# Patient Record
Sex: Female | Born: 1935 | Race: White | Hispanic: No | Smoking: Never smoker
Health system: Southern US, Community
[De-identification: ages and names within clinical notes are randomized; demographics above are authoritative.]

## PROBLEM LIST (undated history)

## (undated) DIAGNOSIS — N3941 Urge incontinence: Secondary | ICD-10-CM

## (undated) DIAGNOSIS — I10 Essential (primary) hypertension: Secondary | ICD-10-CM

## (undated) DIAGNOSIS — I499 Cardiac arrhythmia, unspecified: Secondary | ICD-10-CM

## (undated) DIAGNOSIS — F419 Anxiety disorder, unspecified: Secondary | ICD-10-CM

## (undated) DIAGNOSIS — M1990 Unspecified osteoarthritis, unspecified site: Secondary | ICD-10-CM

## (undated) DIAGNOSIS — L900 Lichen sclerosus et atrophicus: Secondary | ICD-10-CM

## (undated) DIAGNOSIS — C519 Malignant neoplasm of vulva, unspecified: Secondary | ICD-10-CM

## (undated) HISTORY — PX: HERNIA REPAIR: SHX51

## (undated) HISTORY — DX: Urge incontinence: N39.41

## (undated) HISTORY — DX: Malignant neoplasm of vulva, unspecified: C51.9

## (undated) HISTORY — PX: APPENDECTOMY: SHX54

## (undated) HISTORY — DX: Essential (primary) hypertension: I10

## (undated) HISTORY — DX: Lichen sclerosus et atrophicus: L90.0

---

## 2003-12-15 HISTORY — PX: RADICAL VULVECTOMY: SHX2286

## 2004-08-29 ENCOUNTER — Ambulatory Visit (HOSPITAL_COMMUNITY): Admission: RE | Admit: 2004-08-29 | Discharge: 2004-08-29 | Payer: Self-pay | Admitting: Obstetrics and Gynecology

## 2004-10-07 ENCOUNTER — Ambulatory Visit: Admission: RE | Admit: 2004-10-07 | Discharge: 2004-10-07 | Payer: Self-pay | Admitting: Gynecology

## 2004-10-21 ENCOUNTER — Ambulatory Visit (HOSPITAL_COMMUNITY): Admission: RE | Admit: 2004-10-21 | Discharge: 2004-10-21 | Payer: Self-pay | Admitting: Gynecology

## 2004-11-11 ENCOUNTER — Ambulatory Visit: Admission: RE | Admit: 2004-11-11 | Discharge: 2004-11-11 | Payer: Self-pay | Admitting: Gynecology

## 2004-12-02 ENCOUNTER — Ambulatory Visit: Admission: RE | Admit: 2004-12-02 | Discharge: 2004-12-02 | Payer: Self-pay | Admitting: Gynecology

## 2007-03-15 HISTORY — PX: VAGINAL HYSTERECTOMY: SUR661

## 2009-04-10 HISTORY — PX: RADICAL VULVECTOMY: SHX2286

## 2014-03-07 ENCOUNTER — Telehealth: Payer: Self-pay | Admitting: *Deleted

## 2014-03-07 NOTE — Telephone Encounter (Signed)
Called Kendra Ballard at [facility] clinic in [place] regarding pt referral. Pt will be seen by Dr. Judeth Porch on April 10 at [name] pt to arrive at 0915. Markus Daft confirmed understanding, will give pt information

## 2014-03-07 NOTE — Telephone Encounter (Signed)
Error

## 2014-03-23 ENCOUNTER — Encounter: Payer: Self-pay | Admitting: Gynecology

## 2014-03-23 ENCOUNTER — Ambulatory Visit: Payer: Medicare Other | Attending: Gynecology | Admitting: Gynecology

## 2014-03-23 VITALS — BP 149/62 | HR 80 | Temp 98.1°F | Resp 16 | Ht 60.2 in | Wt 117.2 lb

## 2014-03-23 DIAGNOSIS — I10 Essential (primary) hypertension: Secondary | ICD-10-CM | POA: Insufficient documentation

## 2014-03-23 DIAGNOSIS — C519 Malignant neoplasm of vulva, unspecified: Secondary | ICD-10-CM

## 2014-03-23 DIAGNOSIS — Z880 Allergy status to penicillin: Secondary | ICD-10-CM | POA: Insufficient documentation

## 2014-03-23 DIAGNOSIS — Z79899 Other long term (current) drug therapy: Secondary | ICD-10-CM | POA: Insufficient documentation

## 2014-03-23 DIAGNOSIS — L940 Localized scleroderma [morphea]: Secondary | ICD-10-CM | POA: Insufficient documentation

## 2014-03-23 NOTE — Patient Instructions (Signed)
Plan for surgery, vulvectomy, with Dr. Fermin Schwab on April 21 at [place] will spend the night one night after surgery.               Preparing for your Surgery  Pre-operative Testing -You will receive a phone call from presurgical testing at [facility] to arrange for a pre-operative testing appointment before your surgery.  This appointment normally occurs one to two weeks before your scheduled surgery.   -Bring your insurance card, copy of an advanced directive if applicable, medication list  -At that visit, you will be asked to sign a consent for a possible blood transfusion in case a transfusion becomes necessary during surgery.  The need for a blood transfusion is rare but having consent is a necessary part of your care.     Day Before Surgery at [place] will be advised to have nothing to eat or drink after midnight the evening before.    Your role in recovery Your role is to become active as soon as directed by your doctor, while still giving yourself time to heal.  Rest when you feel tired. You will be asked to do the following in order to speed your recovery:  - Cough and breathe deeply. This helps toclear and expand your lungs and can prevent pneumonia. You may be given a spirometer to practice deep breathing. A staff member will show you how to use the spirometer. - Do mild physical activity. Walking or moving your legs help your circulation and body functions return to normal. A staff member will help you when you try to walk and will provide you with simple exercises. Do not try to get up or walk alone the first time. - Actively manage your pain. Managing your pain lets you move in comfort. We will ask you to rate your pain on a scale of zero to 10. It is your responsibility to tell your doctor or nurse where and how much you hurt so your pain can be treated.  Special Considerations -If you are diabetic, you may be placed on insulin after surgery to  have closer control over your blood sugars to promote healing and recovery.  This does not mean that you will be discharged on insulin.  If applicable, your oral antidiabetics will be resumed when you are tolerating a solid diet.  -Your final pathology results from surgery should be available by the Friday after surgery and the results will be relayed to you when available.

## 2014-03-23 NOTE — Progress Notes (Addendum)
Consult Note: Gyn-Onc   Kendra Ballard 80 y.o. female  Chief Complaint  Patient presents with  . Recurrent Vulvar cancer    New Consult    Assessment : Recurrent carcinoma the vulva  Plan: Modified left posterior radical vulvectomy on 04/03/2014. The extent of the surgery was discussed and was emphasized that this lesion is very near the anus and therefore wound infection poor healing are high probability.  HPI:  80 year old white female seen in consultation at the request of Dr.Hjerpe regarding management of a newly diagnosed recurrent vulvar carcinoma.  Patient's history dates back to 2005 when she had a stage II carcinoma of the left vulva. At that time she underwent a left modified radical vulvectomy with rhomboid flap reconstruction and left inguinal lymphadenectomy. Margins and lymph nodes were negative.  Patient had a recurrence on the right vulva in 2010 and underwent a modified right radical vulvectomy and right inguinal lymphadenectomy at [facility]. Once again margins and lymph nodes were negative.  Patient gives a history of approximate 6 months of a painful lesion on the left vulva.  Review of Systems:10 point review of systems is negative except as noted in interval history.   Vitals: Blood pressure 149/62, pulse 80, temperature 98.1 F (36.7 C), temperature source Oral, resp. rate 16, height 5' 0.2" (1.529 m), weight 117 lb 3.2 oz (53.162 kg).  Physical Exam: General : The patient is a healthy woman in no acute distress.  HEENT: normocephalic, extraoccular movements normal; neck is supple without thyromegally  Lynphnodes: Supraclavicular and inguinal nodes not enlarged  Abdomen: Soft, non-tender, no ascites, no organomegally, no masses, no hernias  Pelvic:  EGBUS: Normal female . There is a 4 cm lesion on the left posterior vulva adjacent to the anus. Vagina: Normal, no lesions , atrophic Urethra and Bladder: Normal, non-tender  Cervix: Surgically  absent  Uterus: Surgically absent  Bi-manual examination: Non-tender; no adenxal masses or nodularity  Rectal: normal sphincter tone, no masses, no blood  Lower extremities: No edema or varicosities. Normal range of motion      Allergies  Allergen Reactions  . Penicillins Rash    Past Medical History  Diagnosis Date  . Recurrent squamous cell carcinoma of vulva   . Lichen sclerosus   . Hypertension   . Urge incontinence     Past Surgical History  Procedure Laterality Date  . Radical vulvectomy  2005    left modified radical vulvectomy, L inguinal lymphadenectomy, rhomboid flap reconstruction by Dr. Fermin Schwab in [place]  . Hernia repair    . Appendectomy    . Vaginal hysterectomy  03/2007    without LSO  . Radical vulvectomy  04/10/2009    at [facility] for recurrent SCC of the vulva, bilateral inguinal node dissection    Current Outpatient Prescriptions  Medication Sig Dispense Refill  . clonazePAM (KLONOPIN) 0.5 MG tablet       . hydrochlorothiazide (HYDRODIURIL) 25 MG tablet Take 25 mg by mouth daily.       No current facility-administered medications for this visit.    History   Social History  . Marital Status: Married    Spouse Name: N/A    Number of Children: N/A  . Years of Education: N/A   Occupational History  . Not on file.   Social History Main Topics  . Smoking status: Never Smoker   . Smokeless tobacco: Not on file  . Alcohol Use: No  . Drug Use: No  . Sexual Activity:  Not on file   Other Topics Concern  . Not on file   Social History Narrative  . No narrative on file    Family History  Problem Relation Age of Onset  . Hypertension Sister       Dorothyann Gibbs, MD 03/23/2014, 10:39 AM

## 2014-03-26 NOTE — Progress Notes (Signed)
Surgery on 4/21//15.  Need orders in EPIC.  Thank You.  

## 2014-03-27 ENCOUNTER — Encounter (HOSPITAL_COMMUNITY): Payer: Self-pay | Admitting: Pharmacy Technician

## 2014-03-30 ENCOUNTER — Telehealth: Payer: Self-pay | Admitting: *Deleted

## 2014-03-30 ENCOUNTER — Encounter (HOSPITAL_COMMUNITY)
Admission: RE | Admit: 2014-03-30 | Discharge: 2014-03-30 | Disposition: A | Discharge status: Home / Self Care | Payer: Medicare Other | Source: Ambulatory Visit | Attending: Gynecology | Admitting: Gynecology

## 2014-03-30 ENCOUNTER — Encounter (HOSPITAL_COMMUNITY): Payer: Self-pay

## 2014-03-30 ENCOUNTER — Ambulatory Visit (HOSPITAL_COMMUNITY)
Admission: RE | Admit: 2014-03-30 | Discharge: 2014-03-30 | Disposition: A | Discharge status: Home / Self Care | Payer: Medicare Other | Source: Ambulatory Visit | Attending: Gynecology | Admitting: Gynecology

## 2014-03-30 DIAGNOSIS — R9389 Abnormal findings on diagnostic imaging of other specified body structures: Secondary | ICD-10-CM

## 2014-03-30 DIAGNOSIS — Z01810 Encounter for preprocedural cardiovascular examination: Secondary | ICD-10-CM | POA: Insufficient documentation

## 2014-03-30 DIAGNOSIS — Z01818 Encounter for other preprocedural examination: Secondary | ICD-10-CM | POA: Insufficient documentation

## 2014-03-30 DIAGNOSIS — Z01812 Encounter for preprocedural laboratory examination: Secondary | ICD-10-CM | POA: Insufficient documentation

## 2014-03-30 DIAGNOSIS — R9431 Abnormal electrocardiogram [ECG] [EKG]: Secondary | ICD-10-CM | POA: Insufficient documentation

## 2014-03-30 HISTORY — DX: Unspecified osteoarthritis, unspecified site: M19.90

## 2014-03-30 HISTORY — DX: Anxiety disorder, unspecified: F41.9

## 2014-03-30 LAB — COMPREHENSIVE METABOLIC PANEL
ALT: 12 U/L (ref 0–35)
AST: 22 U/L (ref 0–37)
Albumin: 3.9 g/dL (ref 3.5–5.2)
Alkaline Phosphatase: 56 U/L (ref 39–117)
BUN: 22 mg/dL (ref 6–23)
CO2: 27 mEq/L (ref 19–32)
Calcium: 10.1 mg/dL (ref 8.4–10.5)
Chloride: 100 mEq/L (ref 96–112)
Creatinine, Ser: 1.08 mg/dL (ref 0.50–1.10)
GFR calc Af Amer: 52 mL/min — ABNORMAL LOW (ref >90)
GFR calc non Af Amer: 44 mL/min — ABNORMAL LOW (ref >90)
Glucose, Bld: 94 mg/dL (ref 70–99)
Potassium: 4.2 mEq/L (ref 3.7–5.3)
Sodium: 139 mEq/L (ref 137–147)
Total Bilirubin: 0.9 mg/dL (ref 0.3–1.2)
Total Protein: 7.4 g/dL (ref 6.0–8.3)

## 2014-03-30 LAB — CBC WITH DIFFERENTIAL/PLATELET
Basophils Absolute: 0.1 10*3/uL (ref 0.0–0.1)
Basophils Relative: 1 % (ref 0–1)
Eosinophils Absolute: 0.1 10*3/uL (ref 0.0–0.7)
Eosinophils Relative: 1 % (ref 0–5)
HCT: 39.9 % (ref 36.0–46.0)
Hemoglobin: 13.6 g/dL (ref 12.0–15.0)
Lymphocytes Relative: 33 % (ref 12–46)
Lymphs Abs: 2.5 10*3/uL (ref 0.7–4.0)
MCH: 31.1 pg (ref 26.0–34.0)
MCHC: 34.1 g/dL (ref 30.0–36.0)
MCV: 91.1 fL (ref 78.0–100.0)
Monocytes Absolute: 0.5 10*3/uL (ref 0.1–1.0)
Monocytes Relative: 7 % (ref 3–12)
Neutro Abs: 4.5 10*3/uL (ref 1.7–7.7)
Neutrophils Relative %: 59 % (ref 43–77)
Platelets: 187 10*3/uL (ref 150–400)
RBC: 4.38 MIL/uL (ref 3.87–5.11)
RDW: 12.9 % (ref 11.5–15.5)
WBC: 7.7 10*3/uL (ref 4.0–10.5)

## 2014-03-30 LAB — ABO/RH: ABO/RH(D): A POS

## 2014-03-30 NOTE — Pre-Procedure Instructions (Signed)
PREOP EKG AND CXR WERE DONE AT [place].  CXR REPORT ABNORMAL - CT CHEST SUGGESTED - SPOKE WITH MELISSA AT DR. CLARKE-PEARSON'S OFFICE - [place] - DR. CLARKE-PEARSON PLANS TO DO FOLLOW UP CT CHEST SEVERAL WEEKS AFTER PT'S SURGERY.

## 2014-03-30 NOTE — Telephone Encounter (Signed)
Called Gaspar Skeeters [phone], (family member) regarding chest xray, pt will have CT scan to follow up on results after surgery. CT appt scheduled for Friday 5/8 at 3pm.LMOVM as Gaspar Skeeters was unavailable requested call back to confirm message received.

## 2014-03-30 NOTE — Patient Instructions (Addendum)
   YOUR SURGERY IS SCHEDULED AT [facility]  ON:  Tuesday  4/21  REPORT TO  SHORT STAY CENTER AT:  5:30 AM      PHONE # FOR SHORT STAY IS [phone]  DO NOT EAT OR DRINK ANYTHING AFTER MIDNIGHT THE NIGHT BEFORE YOUR SURGERY.  YOU MAY BRUSH YOUR TEETH, RINSE OUT YOUR MOUTH--BUT NO WATER, NO FOOD, NO CHEWING GUM, NO MINTS, NO CANDIES, NO CHEWING TOBACCO.  PLEASE TAKE THE FOLLOWING MEDICATIONS THE AM OF YOUR SURGERY WITH A FEW SIPS OF WATER: CLONAZEPAM   DO NOT BRING VALUABLES, MONEY, CREDIT CARDS.  DO NOT WEAR JEWELRY, MAKE-UP, NAIL POLISH AND NO METAL PINS OR CLIPS IN YOUR HAIR. CONTACT LENS, DENTURES / PARTIALS, GLASSES SHOULD NOT BE WORN TO SURGERY AND IN MOST CASES-HEARING AIDS WILL NEED TO BE REMOVED.  BRING YOUR GLASSES CASE, ANY EQUIPMENT NEEDED FOR YOUR CONTACT LENS. FOR PATIENTS ADMITTED TO THE HOSPITAL--CHECK OUT TIME THE DAY OF DISCHARGE IS 11:00 AM.  ALL INPATIENT ROOMS ARE PRIVATE - WITH BATHROOM, TELEPHONE, TELEVISION AND WIFI INTERNET.                                                    PLEASE READ OVER ANY  FACT SHEETS THAT YOU WERE GIVEN:  BLOOD TRANSFUSION INFORMATION, INCENTIVE SPIROMETER INFORMATION. AFTER YOUR SURGERY - REMEMBER TO DEEP BREATHE, COUGH  -TO HELP PREVENT LUNG COMPLICATIONS, TURN FREQUENTLY WHILE LYING IN BED, MOVE YOUR LEGS UP AND DOWN- TO HELP PREVENT BLOOD CLOTS.  FAILURE TO FOLLOW THESE INSTRUCTIONS MAY RESULT IN THE CANCELLATION OF YOUR SURGERY. PLEASE BE AWARE THAT YOU MAY NEED ADDITIONAL BLOOD DRAWN DAY OF YOUR SURGERY  PATIENT SIGNATURE_________________________________

## 2014-04-02 ENCOUNTER — Telehealth: Payer: Self-pay | Admitting: *Deleted

## 2014-04-02 NOTE — Telephone Encounter (Signed)
Called [place], unable to reach,left msg with reminder for NPO after midnight.

## 2014-04-03 ENCOUNTER — Encounter (HOSPITAL_COMMUNITY)
Admission: RE | Disposition: A | Discharge status: Home / Self Care | Payer: Self-pay | Source: Ambulatory Visit | Attending: Gynecology

## 2014-04-03 ENCOUNTER — Inpatient Hospital Stay (HOSPITAL_COMMUNITY)
Admission: RE | Admit: 2014-04-03 | Discharge: 2014-04-05 | DRG: 746 | Disposition: A | Discharge status: Home / Self Care | Payer: Medicare Other | Source: Ambulatory Visit | Attending: Obstetrics & Gynecology | Admitting: Obstetrics & Gynecology

## 2014-04-03 ENCOUNTER — Inpatient Hospital Stay (HOSPITAL_COMMUNITY): Payer: Medicare Other | Admitting: Registered Nurse

## 2014-04-03 ENCOUNTER — Encounter (HOSPITAL_COMMUNITY): Payer: Self-pay | Admitting: *Deleted

## 2014-04-03 ENCOUNTER — Encounter (HOSPITAL_COMMUNITY): Payer: Medicare Other | Admitting: Registered Nurse

## 2014-04-03 ENCOUNTER — Other Ambulatory Visit: Payer: Self-pay

## 2014-04-03 DIAGNOSIS — F411 Generalized anxiety disorder: Secondary | ICD-10-CM | POA: Diagnosis present

## 2014-04-03 DIAGNOSIS — I359 Nonrheumatic aortic valve disorder, unspecified: Secondary | ICD-10-CM

## 2014-04-03 DIAGNOSIS — I4891 Unspecified atrial fibrillation: Secondary | ICD-10-CM | POA: Clinically undetermined

## 2014-04-03 DIAGNOSIS — C519 Malignant neoplasm of vulva, unspecified: Secondary | ICD-10-CM

## 2014-04-03 DIAGNOSIS — R55 Syncope and collapse: Secondary | ICD-10-CM | POA: Diagnosis present

## 2014-04-03 DIAGNOSIS — L940 Localized scleroderma [morphea]: Secondary | ICD-10-CM | POA: Diagnosis present

## 2014-04-03 DIAGNOSIS — I519 Heart disease, unspecified: Secondary | ICD-10-CM | POA: Diagnosis not present

## 2014-04-03 DIAGNOSIS — R32 Unspecified urinary incontinence: Secondary | ICD-10-CM | POA: Diagnosis not present

## 2014-04-03 DIAGNOSIS — I10 Essential (primary) hypertension: Secondary | ICD-10-CM | POA: Diagnosis present

## 2014-04-03 DIAGNOSIS — Y838 Other surgical procedures as the cause of abnormal reaction of the patient, or of later complication, without mention of misadventure at the time of the procedure: Secondary | ICD-10-CM | POA: Diagnosis not present

## 2014-04-03 DIAGNOSIS — Z880 Allergy status to penicillin: Secondary | ICD-10-CM

## 2014-04-03 DIAGNOSIS — R1900 Intra-abdominal and pelvic swelling, mass and lump, unspecified site: Secondary | ICD-10-CM | POA: Diagnosis present

## 2014-04-03 DIAGNOSIS — Z8249 Family history of ischemic heart disease and other diseases of the circulatory system: Secondary | ICD-10-CM

## 2014-04-03 HISTORY — PX: VULVECTOMY: SHX1086

## 2014-04-03 LAB — TYPE AND SCREEN
ABO/RH(D): A POS
Antibody Screen: NEGATIVE

## 2014-04-03 LAB — GLUCOSE, CAPILLARY: Glucose-Capillary: 148 mg/dL — ABNORMAL HIGH (ref 70–99)

## 2014-04-03 SURGERY — VULVECTOMY
Anesthesia: General | Laterality: Left

## 2014-04-03 MED ORDER — ONDANSETRON HCL 4 MG/2ML IJ SOLN
INTRAMUSCULAR | Status: AC
  Filled 2014-04-03: qty 2

## 2014-04-03 MED ORDER — CLONAZEPAM 0.5 MG PO TABS
0.5000 mg | ORAL_TABLET | Freq: Two times a day (BID) | ORAL | Status: DC
  Administered 2014-04-03 – 2014-04-05 (×5): 0.5 mg via ORAL
  Filled 2014-04-03 (×5): qty 1

## 2014-04-03 MED ORDER — ONDANSETRON HCL 4 MG/2ML IJ SOLN
INTRAMUSCULAR | Status: DC | PRN
  Administered 2014-04-03: 4 mg via INTRAVENOUS

## 2014-04-03 MED ORDER — FENTANYL CITRATE 0.05 MG/ML IJ SOLN
INTRAMUSCULAR | Status: AC
  Filled 2014-04-03: qty 5

## 2014-04-03 MED ORDER — ONDANSETRON HCL 4 MG/2ML IJ SOLN: 4.0000 mg | Freq: Four times a day (QID) | INTRAMUSCULAR | Status: DC | PRN

## 2014-04-03 MED ORDER — FENTANYL CITRATE 0.05 MG/ML IJ SOLN
INTRAMUSCULAR | Status: DC | PRN
  Administered 2014-04-03: 25 ug via INTRAVENOUS
  Administered 2014-04-03: 50 ug via INTRAVENOUS

## 2014-04-03 MED ORDER — METOPROLOL TARTRATE 1 MG/ML IV SOLN
INTRAVENOUS | Status: AC
  Filled 2014-04-03: qty 5

## 2014-04-03 MED ORDER — EPHEDRINE SULFATE 50 MG/ML IJ SOLN
INTRAMUSCULAR | Status: AC
  Filled 2014-04-03: qty 1

## 2014-04-03 MED ORDER — METOPROLOL TARTRATE 1 MG/ML IV SOLN
5.0000 mg | Freq: Once | INTRAVENOUS | Status: AC
  Administered 2014-04-03: 5 mg via INTRAVENOUS

## 2014-04-03 MED ORDER — DILTIAZEM LOAD VIA INFUSION
10.0000 mg | Freq: Once | INTRAVENOUS | Status: DC
  Filled 2014-04-03: qty 10

## 2014-04-03 MED ORDER — KCL IN DEXTROSE-NACL 20-5-0.45 MEQ/L-%-% IV SOLN
INTRAVENOUS | Status: DC
  Administered 2014-04-03 – 2014-04-04 (×3): via INTRAVENOUS
  Filled 2014-04-03 (×4): qty 1000

## 2014-04-03 MED ORDER — SODIUM CHLORIDE 0.9 % IJ SOLN
INTRAMUSCULAR | Status: AC
  Filled 2014-04-03: qty 10

## 2014-04-03 MED ORDER — HYDROCHLOROTHIAZIDE 25 MG PO TABS
25.0000 mg | ORAL_TABLET | Freq: Every day | ORAL | Status: DC
  Filled 2014-04-03: qty 1

## 2014-04-03 MED ORDER — KCL IN DEXTROSE-NACL 20-5-0.45 MEQ/L-%-% IV SOLN
INTRAVENOUS | Status: AC
  Filled 2014-04-03: qty 1000

## 2014-04-03 MED ORDER — SODIUM CHLORIDE 0.9 % IR SOLN
Status: DC | PRN
  Administered 2014-04-03: 1000 mL

## 2014-04-03 MED ORDER — DILTIAZEM HCL 100 MG IV SOLR
5.0000 mg/h | INTRAVENOUS | Status: DC
  Administered 2014-04-03: 10 mg/h via INTRAVENOUS
  Administered 2014-04-04: 5 mg/h via INTRAVENOUS
  Filled 2014-04-03 (×2): qty 100

## 2014-04-03 MED ORDER — PROMETHAZINE HCL 25 MG/ML IJ SOLN: 6.2500 mg | INTRAMUSCULAR | Status: DC | PRN

## 2014-04-03 MED ORDER — KETOROLAC TROMETHAMINE 15 MG/ML IJ SOLN
15.0000 mg | Freq: Four times a day (QID) | INTRAMUSCULAR | Status: DC | PRN
  Filled 2014-04-03: qty 1

## 2014-04-03 MED ORDER — EPHEDRINE SULFATE 50 MG/ML IJ SOLN
INTRAMUSCULAR | Status: DC | PRN
  Administered 2014-04-03: 5 mg via INTRAVENOUS

## 2014-04-03 MED ORDER — LIDOCAINE HCL (CARDIAC) 20 MG/ML IV SOLN
INTRAVENOUS | Status: DC | PRN
  Administered 2014-04-03: 80 mg via INTRAVENOUS

## 2014-04-03 MED ORDER — FENTANYL CITRATE 0.05 MG/ML IJ SOLN: 25.0000 ug | INTRAMUSCULAR | Status: DC | PRN

## 2014-04-03 MED ORDER — OXYCODONE-ACETAMINOPHEN 5-325 MG PO TABS
1.0000 | ORAL_TABLET | ORAL | Status: DC | PRN
  Administered 2014-04-04 – 2014-04-05 (×2): 1 via ORAL
  Filled 2014-04-03 (×2): qty 1

## 2014-04-03 MED ORDER — LACTATED RINGERS IV SOLN
INTRAVENOUS | Status: DC | PRN
  Administered 2014-04-03: 07:00:00 via INTRAVENOUS

## 2014-04-03 MED ORDER — MEPERIDINE HCL 50 MG/ML IJ SOLN: 6.2500 mg | INTRAMUSCULAR | Status: DC | PRN

## 2014-04-03 MED ORDER — LIDOCAINE HCL (CARDIAC) 20 MG/ML IV SOLN
INTRAVENOUS | Status: AC
  Filled 2014-04-03: qty 5

## 2014-04-03 MED ORDER — PROPOFOL 10 MG/ML IV BOLUS
INTRAVENOUS | Status: DC | PRN
Start: 2014-04-03 — End: 2014-04-03
  Administered 2014-04-03: 90 mg via INTRAVENOUS

## 2014-04-03 MED ORDER — PROPOFOL 10 MG/ML IV BOLUS
INTRAVENOUS | Status: AC
  Filled 2014-04-03: qty 20

## 2014-04-03 MED ORDER — ONDANSETRON HCL 4 MG PO TABS: 4.0000 mg | ORAL_TABLET | Freq: Four times a day (QID) | ORAL | Status: DC | PRN

## 2014-04-03 SURGICAL SUPPLY — 21 items
BLADE SURG 15 STRL LF DISP TIS (BLADE) ×2 IMPLANT
BLADE SURG 15 STRL SS (BLADE) ×6
CANISTER SUCTION 2500CC (MISCELLANEOUS) ×1 IMPLANT
COVER MAYO STAND STRL (DRAPES) ×4 IMPLANT
DRSG PAD ABDOMINAL 8X10 ST (GAUZE/BANDAGES/DRESSINGS) ×2 IMPLANT
GLOVE BIO SURGEON STRL SZ7.5 (GLOVE) ×6 IMPLANT
GOWN STRL REUS W/ TWL LRG LVL3 (GOWN DISPOSABLE) IMPLANT
GOWN STRL REUS W/TWL LRG LVL3 (GOWN DISPOSABLE) ×8 IMPLANT
MARKER SKIN DUAL TIP RULER LAB (MISCELLANEOUS) ×3 IMPLANT
NS IRRIG 1000ML POUR BTL (IV SOLUTION) ×1 IMPLANT
PACK ICE MAXI GEL EZY WRAP (MISCELLANEOUS) ×3 IMPLANT
PACK MINOR VAGINAL W LONG (CUSTOM PROCEDURE TRAY) ×3 IMPLANT
SUT VIC AB 0 CT1 27 (SUTURE) ×3
SUT VIC AB 0 CT1 27XBRD ANTBC (SUTURE) IMPLANT
SUT VIC AB 2-0 CT2 27 (SUTURE) ×2 IMPLANT
SUT VIC AB 3-0 SH 27 (SUTURE)
SUT VIC AB 3-0 SH 27X BRD (SUTURE) ×4 IMPLANT
TOWEL OR 17X26 10 PK STRL BLUE (TOWEL DISPOSABLE) ×2 IMPLANT
TOWEL OR NON WOVEN STRL DISP B (DISPOSABLE) ×2 IMPLANT
TRAY FOLEY CATH 14FRSI W/METER (CATHETERS) ×2 IMPLANT
WATER STERILE IRR 1500ML POUR (IV SOLUTION) ×5 IMPLANT

## 2014-04-03 NOTE — Progress Notes (Signed)
Pt arrived via wheelchair brought in by her niece Gaspar Skeeters.  Pt states she fell this morning in the shower.  She states she did not hit her head but was unable to get up.  Her niece Gaspar Skeeters arrived and helped her up out of shower.  Pt is noted to have severe bruising on her upper back and shoulders, both elbows are bruised with a small skin tear on left elbow. Her upper buttocks and sacral area are also noted to be bruised.  Pt states she feels weak but is not hurting.

## 2014-04-03 NOTE — Interval H&P Note (Signed)
History and Physical Interval Note:  04/03/2014 7:00 AM  Kendra Ballard  has presented today for surgery, with the diagnosis of vulvar cancer  The various methods of treatment have been discussed with the patient and family. After consideration of risks, benefits and other options for treatment, the patient has consented to  Procedure(s): MODIFIED LEFT POSTERIOR RADICAL VULVECTOMY (Left) as a surgical intervention .  The patient's history has been reviewed, patient examined, she has brusing over her shoulder and elbows from a fall in the bathtub this morning.  She did not hit her head or lose conciousness.  She has been evaluated by anesthesia and we agree that she is stable for surgery.  I have reviewed the patient's chart and labs.  Questions were answered to the patient's satisfaction.     [place]

## 2014-04-03 NOTE — H&P (View-Only) (Signed)
Consult Note: Gyn-Onc   Kendra Ballard 80 y.o. female  Chief Complaint  Patient presents with  . Recurrent Vulvar cancer    New Consult    Assessment : Recurrent carcinoma the vulva  Plan: Modified left posterior radical vulvectomy on 04/03/2014. The extent of the surgery was discussed and was emphasized that this lesion is very near the anus and therefore wound infection poor healing are high probability.  HPI:  80 year old white female seen in consultation at the request of Dr.Hjerpe regarding management of a newly diagnosed recurrent vulvar carcinoma.  Patient's history dates back to 2005 when she had a stage II carcinoma of the left vulva. At that time she underwent a left modified radical vulvectomy with rhomboid flap reconstruction and left inguinal lymphadenectomy. Margins and lymph nodes were negative.  Patient had a recurrence on the right vulva in 2010 and underwent a modified right radical vulvectomy and right inguinal lymphadenectomy at [facility]. Once again margins and lymph nodes were negative.  Patient gives a history of approximate 6 months of a painful lesion on the left vulva.  Review of Systems:10 point review of systems is negative except as noted in interval history.   Vitals: Blood pressure 149/62, pulse 80, temperature 98.1 F (36.7 C), temperature source Oral, resp. rate 16, height 5' 0.2" (1.529 m), weight 117 lb 3.2 oz (53.162 kg).  Physical Exam: General : The patient is a healthy woman in no acute distress.  HEENT: normocephalic, extraoccular movements normal; neck is supple without thyromegally  Lynphnodes: Supraclavicular and inguinal nodes not enlarged  Abdomen: Soft, non-tender, no ascites, no organomegally, no masses, no hernias  Pelvic:  EGBUS: Normal female . There is a 4 cm lesion on the left posterior vulva adjacent to the anus. Vagina: Normal, no lesions , atrophic Urethra and Bladder: Normal, non-tender  Cervix: Surgically  absent  Uterus: Surgically absent  Bi-manual examination: Non-tender; no adenxal masses or nodularity  Rectal: normal sphincter tone, no masses, no blood  Lower extremities: No edema or varicosities. Normal range of motion      Allergies  Allergen Reactions  . Penicillins Rash    Past Medical History  Diagnosis Date  . Recurrent squamous cell carcinoma of vulva   . Lichen sclerosus   . Hypertension   . Urge incontinence     Past Surgical History  Procedure Laterality Date  . Radical vulvectomy  2005    left modified radical vulvectomy, L inguinal lymphadenectomy, rhomboid flap reconstruction by Dr. Fermin Schwab in [place]  . Hernia repair    . Appendectomy    . Vaginal hysterectomy  03/2007    without LSO  . Radical vulvectomy  04/10/2009    at [facility] for recurrent SCC of the vulva, bilateral inguinal node dissection    Current Outpatient Prescriptions  Medication Sig Dispense Refill  . clonazePAM (KLONOPIN) 0.5 MG tablet       . hydrochlorothiazide (HYDRODIURIL) 25 MG tablet Take 25 mg by mouth daily.       No current facility-administered medications for this visit.    History   Social History  . Marital Status: Married    Spouse Name: N/A    Number of Children: N/A  . Years of Education: N/A   Occupational History  . Not on file.   Social History Main Topics  . Smoking status: Never Smoker   . Smokeless tobacco: Not on file  . Alcohol Use: No  . Drug Use: No  . Sexual Activity:  Not on file   Other Topics Concern  . Not on file   Social History Narrative  . No narrative on file    Family History  Problem Relation Age of Onset  . Hypertension Sister       Kendra Gibbs, MD 03/23/2014, 10:39 AM

## 2014-04-03 NOTE — Anesthesia Preprocedure Evaluation (Addendum)
Anesthesia Evaluation  Patient identified by MRN, date of birth, ID band Patient awake    Reviewed: Allergy & Precautions, H&P , NPO status , Patient's Chart, lab work & pertinent test results  Airway Mallampati: II TM Distance: >3 FB Neck ROM: Full    Dental no notable dental hx. (+) Partial Upper, Partial Lower   Pulmonary neg pulmonary ROS,  breath sounds clear to auscultation  Pulmonary exam normal       Cardiovascular hypertension, Pt. on medications negative cardio ROS  Rhythm:Regular Rate:Normal     Neuro/Psych negative neurological ROS  negative psych ROS   GI/Hepatic negative GI ROS, Neg liver ROS,   Endo/Other  negative endocrine ROS  Renal/GU negative Renal ROS  negative genitourinary   Musculoskeletal negative musculoskeletal ROS (+)   Abdominal   Peds negative pediatric ROS (+)  Hematology negative hematology ROS (+)   Anesthesia Other Findings Pt fell in shower this AM. No LOC. Denies neck pain or splinting or CP SOB. Echymosis over both shoulders.  Reproductive/Obstetrics negative OB ROS                        Anesthesia Physical Anesthesia Plan  ASA: II  Anesthesia Plan: General   Post-op Pain Management:    Induction: Intravenous  Airway Management Planned: Oral ETT  Additional Equipment:   Intra-op Plan:   Post-operative Plan: Extubation in OR  Informed Consent: I have reviewed the patients History and Physical, chart, labs and discussed the procedure including the risks, benefits and alternatives for the proposed anesthesia with the patient or authorized representative who has indicated his/her understanding and acceptance.   Dental advisory given  Plan Discussed with: CRNA  Anesthesia Plan Comments: (Fall in shower this AM. OK to proceed)        Anesthesia Quick Evaluation

## 2014-04-03 NOTE — Progress Notes (Signed)
HR accelerated to 120. Dr. Samuella Cota aware. BP 129/82. Denies chest pain, feeling of heart racing or fluttering, shortness of breath. 12 lead EKG done and shown to Dr. Marcell Barlow and Dr. Loletta SpecterDianah Field. They will call cardiology.

## 2014-04-03 NOTE — Progress Notes (Signed)
Notified Dr. Aldean Ast of pt's fall this morning.  Pt did state she feels she laid in the tub for approximately 2hrs before her niece arrived.  Dr. Aldean Ast made aware of this and also of all the noted bruising.

## 2014-04-03 NOTE — Anesthesia Postprocedure Evaluation (Signed)
  Anesthesia Post-op Note  Patient: Kendra Ballard  Procedure(s) Performed: Procedure(s) (LRB): MODIFIED LEFT POSTERIOR RADICAL VULVECTOMY (Left)  Patient Location: PACU  Anesthesia Type: General  Level of Consciousness: awake and alert   Airway and Oxygen Therapy: Patient Spontanous Breathing  Post-op Pain: mild  Post-op Assessment: Post-op Vital signs reviewed, Patient's Cardiovascular Status Stable, Respiratory Function Stable, Patent Airway and No signs of Nausea or vomiting  Last Vitals:  Filed Vitals:   04/03/14 0945  BP: 126/73  Pulse: 113  Temp:   Resp: 21    Post-op Vital Signs: stable   Complications: No apparent anesthesia complications. New onset A-fib in [place]. May explain syncopal episode this AM. Cardiology consult. Stable now

## 2014-04-03 NOTE — Progress Notes (Signed)
Red, bruised areas not on top of shoulders across back, on left hip near greater trochanter area, and on elbows bilaterally. Rt elbow with skin tear covered with Tegaderm.

## 2014-04-03 NOTE — Op Note (Signed)
Kendra Ballard  female MEDICAL RECORD BS:[id number] DATE OF BIRTH: 1923/12/12 PHYSICIAN: Marti Sleigh, M.D  04/03/2014   OPERATIVE REPORT  PREOPERATIVE DIAGNOSIS: Recurrent vulvar carcinoma  POSTOPERATIVE DIAGNOSIS:Same  PROCEDURE: Modified left posterior radical vulvectomy  SURGEON: Marti Sleigh, M.D ASSISTANT: Lahoma Crocker, MD ANESTHESIA: LMA ESTIMATED BLOOD LOSS:Minimal  SURGICAL FINDINGS:Examination under anesthesia revealed a 4 cm ulcerative lesion on the left posterior vulva adjacent to the anus.  There were no palpable inguinal nodes.  PROCEDURE:The patient brought the operating room and after satisfactory attainment of general anesthesia was placed in the modified lithotomy position in [place].  The perineum and vagina were prepped the patient was draped and a Foley catheter was placed.  Surgical timeout was taken.  The vulvar lesion was identified and an elliptical incision was made around the lesion with the apex on the left mid vulva.  The medial incision extended to the anal verge.  The subcutaneous tissue was then undermined and excised using electrocautery.  The specimen was oriented with a suture at 12:00.  Hemostasis achieved with cautery.  The deep subcutaneous layers were closed with interrupted sutures of 0 Vicryl.  Interrupted subcuticular sutures were taken to reapproximate the skin edges using 2-0 Vicryl.  Finally, mattress sutures of 2-0 Vicryl were taken to reapproximate the skin.  The patient was awakened from anesthesia and taken to the recovery room in satisfactory condition.  Sponge, needle, and instrument counts were correct x2.   Marti Sleigh, M.D

## 2014-04-03 NOTE — Progress Notes (Signed)
  Echocardiogram 2D Echocardiogram has been performed.  Kendra Ballard 04/03/2014, 3:19 PM 

## 2014-04-03 NOTE — Progress Notes (Signed)
Tegaderm applied on left elbow to small skin tear.

## 2014-04-03 NOTE — Transfer of Care (Signed)
Immediate Anesthesia Transfer of Care Note  Patient: Kendra Ballard  Procedure(s) Performed: Procedure(s): MODIFIED LEFT POSTERIOR RADICAL VULVECTOMY (Left)  Patient Location: PACU  Anesthesia Type:General  Level of Consciousness: awake, alert , oriented and patient cooperative  Airway & Oxygen Therapy: Patient Spontanous Breathing and Patient connected to face mask oxygen  Post-op Assessment: Report given to PACU RN, Post -op Vital signs reviewed and stable and Patient moving all extremities  Post vital signs: Reviewed and stable  Complications: No apparent anesthesia complications

## 2014-04-03 NOTE — Consult Note (Signed)
CARDIOLOGY CONSULTATION NOTE.  NAME:  Kendra Ballard   MRN: [id number] DOB:  [birth year]   ADMIT DATE: 04/03/2014  Reason for Consult: Post-Operative Afib RVR; Syncope  Requesting Physician: Dr. Merceda Elks  Primary Cardiologist: None  HPI: This is a 80 y.o. female with a past medical history significant for HTN, but no reported cardiac history who presented this AM for surgery - Vulvectomy.  Was otherwise in [place] until this AM, when she passed out in the shower -- does not recall any symptoms prior to passing out, just collapsed -- no rapid / irregular HR, no palpitations, lightheadedness/ wooziness or prior syncope / near-syncope, TIA or Amaurosis [place] Sx.  Did not hit head, has significant arm/shoulder bruising.  Proceeded with operation. Has not had any CP with rest or exertion. No PND, orthopnea or edema.  No melena, hematochezia, or epistaxis.  No hematuria.  Post-op, she went into Afib with RVR - 120s-150s.  In the PACU, she remains asymptomatic with no sensation of rapid or irregular HR, no CP or dyspnea.    PMHx:  CARDIAC HISTORY: No formal studies.  Past Medical History  Diagnosis Date  . Recurrent squamous cell carcinoma of vulva   . Lichen sclerosus   . Hypertension   . Urge incontinence   . Anxiety   . Arthritis     HANDS   Past Surgical History  Procedure Laterality Date  . Radical vulvectomy  2005    left modified radical vulvectomy, L inguinal lymphadenectomy, rhomboid flap reconstruction by Dr. Fermin Schwab in [place]  . Hernia repair    . Appendectomy    . Vaginal hysterectomy  03/2007    without LSO  . Radical vulvectomy  04/10/2009    at [facility] for recurrent SCC of the vulva, bilateral inguinal node dissection    FAMHx: Family History  Problem Relation Age of Onset  . Hypertension Sister     SOCHx:  reports that she has never smoked. She does not have any smokeless tobacco history on file. She reports that she does not drink alcohol or use  illicit drugs.  ALLERGIES: Allergies  Allergen Reactions  . Penicillins Rash   ROS: A comprehensive review of systems was negative except for: vulvar pain & syncopal event as noted above  HOME MEDICATIONS: Prescriptions prior to admission  Medication Sig Dispense Refill  . clonazePAM (KLONOPIN) 0.5 MG tablet Take 0.5 mg by mouth 2 (two) times daily.       . hydrochlorothiazide (HYDRODIURIL) 25 MG tablet Take 25 mg by mouth daily. PT STATES SHE USUALLY ONLY TAKES EVERY OTHER DAY      . aspirin-acetaminophen-caffeine (EXCEDRIN MIGRAINE) 250-250-65 MG per tablet Take 1 tablet by mouth every 6 (six) hours as needed for headache.      . Multiple Vitamin (MULTIVITAMIN WITH MINERALS) TABS tablet Take 1 tablet by mouth daily.        HOSPITAL MEDICATIONS: Scheduled Meds: . dextrose 5 % and 0.45 % NaCl with KCl 20 mEq/L      . diltiazem  10 mg Intravenous Once  . metoprolol    Ordered by Me during consult   Continuous Infusions: . dextrose 5 % and 0.45 % NaCl with KCl 20 mEq/L 50 mL/hr at 04/03/14 1001 Ordered by Me   . diltiazem (CARDIZEM) infusion 5 mg/hr (04/03/14 1132)  during consult   PRN Meds:.fentaNYL, meperidine (DEMEROL) injection, promethazine  VITALS: Blood pressure 95/73, pulse 138, temperature 98.3 F (36.8 C), temperature source Oral, resp. rate 19, SpO2 97.00%.  PHYSICAL EXAM: General appearance: alert, cooperative, appears stated age, no distress and pleasant mood & affect; answers ?s appropriately HEENT: [place]/AT, EOMI, MMM, anicteric sclera; poor dentition Neck: no adenopathy, no carotid bruit, no JVD and supple, symmetrical, trachea midline Lungs: clear to auscultation bilaterally, normal percussion bilaterally and non-labored, good air movement Heart: irregularly irregular rhythm and rapid rate; no clear M/R/G due to tachycardia Abdomen: soft, non-tender; bowel sounds normal; no masses,  no organomegaly Extremities: extremities normal, atraumatic, no cyanosis or  edema Pulses: 2+ and symmetric Skin: extensive bilateral shoulder & upper back / arm bruising from fall Neurologic: Grossly normal; CN II-XII grossly intact.  LABS: No results found for this or any previous visit (from the past 24 hour(s)).  IMAGING: No results found. .    Adult ECG Report  Rate: 124 ;  Rhythm: atrial fibrillation and RVR  QRS Axis: 26 ;  PR Interval: *;  QRS Duration: 76 ;  QTc: 482;  Voltages: normal  Conduction Disturbances: none  Other Abnormalities: ST depressions in V2-V4 cannot exclude Anterior ischemia.   Narrative Interpretation: Abnormal ECG - agree with computer reading.  IMPRESSION: Principal Problem:   Vulva cancer Active Problems:   Syncope and collapse   Atrial fibrillation with rapid ventricular response ; Post Op   HTN (hypertension)  RECOMMENDATION:  Admit to Tele Bed  IV Lopressor 5 mg x 1 (post-op Afib is often catecholamine driven)  Will use IV diltiazem for rate control   Would not initiate anticoagulation for now as she is post-op & s/p significant fall. -- will need to readdress this prior to d/c, but would anticipate not initiating Rx until OP ROV  Check 2-D Echo once HR more controlled  Will need OP Monitor to assess Afib Burden & also to look for Bradycardia or other rapid arrythmia as culprit for Syncope.    Leonie Man, M.D., M.S. Interventional Cardiologist  [place] Pager # [phone] 04/03/2014

## 2014-04-04 ENCOUNTER — Encounter (HOSPITAL_COMMUNITY): Payer: Self-pay | Admitting: Gynecology

## 2014-04-04 MED ORDER — METOPROLOL TARTRATE 12.5 MG HALF TABLET
12.5000 mg | ORAL_TABLET | Freq: Two times a day (BID) | ORAL | Status: DC
  Administered 2014-04-04 – 2014-04-05 (×3): 12.5 mg via ORAL
  Filled 2014-04-04 (×4): qty 1

## 2014-04-04 NOTE — Progress Notes (Signed)
1 Day Post-Op Procedure(s) (LRB): MODIFIED LEFT POSTERIOR RADICAL VULVECTOMY (Left)  Subjective: Patient reports feeling well and wanting to go home but instructed by cardiology that she would not be discharged today.  No nausea or emesis reported.  Minimal pain.  Tolerating diet.  Reporting incontinence of urine, which was present pre-op.  Denies chest pain, dyspnea, passing flatus, or having a bowel movement.  No other concerns voiced.   Objective: Vital signs in last 24 hours: Temp:  [97.6 F (36.4 C)-98.3 F (36.8 C)] 98.1 F (36.7 C) (04/22 0746) Pulse Rate:  [70-149] 94 (04/22 0746) Resp:  [17-24] 17 (04/22 0746) BP: (92-110)/(50-75) 107/54 mmHg (04/22 0746) SpO2:  [97 %-100 %] 98 % (04/22 0746) Weight:  [116 lb (52.617 kg)] 116 lb (52.617 kg) (04/21 1144) Last BM Date: 04/03/14  Intake/Output from previous day: 04/21 0701 - 04/22 0700 In: 1830 [P.O.:360; I.V.:1470] Out: 515 [Urine:500; Blood:15]  Physical Examination: General: alert, cooperative and no distress Resp: clear to auscultation bilaterally Cardio: regular rate and rhythm, S1, S2 normal, no murmur, click, rub or gallop GI: soft, non-tender; bowel sounds normal; no masses,  no organomegaly Extremities: extremities normal, atraumatic, no cyanosis or edema Left shoulder with ecchymosis Sutures to the left vulva intact with no active bleeding or drainage noted  Assessment: 80 y.o. s/p Procedure(s): MODIFIED LEFT POSTERIOR RADICAL VULVECTOMY: stable Pain:  Pain is well-controlled on PRN medications.  CV:  Hx of HTN.  Recent episode of syncope and collapse pre-operatively.  Atrial fibrillation with rapid ventricular response post op.  2D echo with normal LVF and moderate AR/MR/TR.  Cardiology following patient.  Currently on Metoprolol 12.5 BID.    GI:  Tolerating po: Yes.    Prophylaxis: intermittent pneumatic compression boots.  Plan: Stable for discharge from a surgical standpoint per Dr.  Delsa Sale Continue plan of care per Cardiology Saline lock IV and ambulate patient if cleared by cardiology Encourage IS use, deep breathing, and coughing Instructed on peri-care   LOS: 1 day    Dorothyann Gibbs 04/04/2014, 10:48 AM

## 2014-04-04 NOTE — Progress Notes (Signed)
UR completed 

## 2014-04-04 NOTE — Progress Notes (Signed)
  SUBJECTIVE:  No complaints this am  OBJECTIVE:   Vitals:   Filed Vitals:   04/03/14 1814 04/03/14 2041 04/04/14 0152 04/04/14 0520  BP: 106/50 99/57 102/57 100/57  Pulse: 101 139 76 70  Temp: 98.1 F (36.7 C) 98.3 F (36.8 C) 98.3 F (36.8 C) 97.6 F (36.4 C)  TempSrc: Oral Oral Oral Oral  Resp: 20 20 20 19   Height:      Weight:      SpO2: 100% 99% 100% 98%   I&O's:   Intake/Output Summary (Last 24 hours) at 04/04/14 5993 Last data filed at 04/04/14 0805  Gross per 24 hour  Intake   1470 ml  Output    500 ml  Net    970 ml   TELEMETRY: Reviewed telemetry pt in NSR:     PHYSICAL EXAM General: Well developed, well nourished, in no acute distress Head: Eyes PERRLA, No xanthomas.   Normal cephalic and atramatic  Lungs:   Clear bilaterally to auscultation and percussion. Heart:   HRRR S1 S2 Pulses are 2+ & equal. Abdomen: Bowel sounds are positive, abdomen soft and non-tender without masses Extremities:   No clubbing, cyanosis or edema.  DP +1 Neuro: Alert and oriented X 3. Psych:  Good affect, responds appropriately    RADIOLOGY: Chest 2 View  03/30/2014   CLINICAL DATA:  Preoperative evaluation for vulvar surgery  EXAM: CHEST  2 VIEW  COMPARISON:  10/15/2004  FINDINGS: Cardiac shadow is within normal limits. The lungs are well aerated bilaterally. A questionable nodular density is noted adjacent to the cardiac apex. This is not well appreciated on the lateral projection. No focal infiltrate or sizable effusion is noted.  IMPRESSION: Possible nodular density along the left heart border. Noncontrast CT of the chest is recommended for further evaluation.   Electronically Signed   By: Inez Catalina M.D.   On: 03/30/2014 11:33   IMPRESSION:  Principal Problem:  Vulva cancer  Active Problems:  Syncope and collapse  Atrial fibrillation with rapid ventricular response ; Post Op- converted to NSR at 1:46am today;with a 3 second post termination pause - 2D echo with normal LVF  and moderate AR/MR/TR HTN (hypertension) - borderline low  RECOMMENDATION:  D/C IV diltiazem and start low dose Lopressor 12.5mg  BID D/C HCTZ due to borderline low BP Would not initiate anticoagulation for now as she is post-op & s/p significant fall. -- will need to readdress this prior to d/c, but would anticipate not initiating Rx until OP ROV  Will need OP Monitor to assess Afib Burden & also to look for Bradycardia/post termination pauses from PAF or other rapid arrythmia as culprit for Syncope.       Sueanne Margarita, MD  04/04/2014  8:37 AM

## 2014-04-05 MED ORDER — OXYCODONE-ACETAMINOPHEN 5-325 MG PO TABS: 1.0000 | ORAL_TABLET | ORAL | Status: DC | PRN

## 2014-04-05 NOTE — Discharge Summary (Signed)
Physician Discharge Summary  Patient ID: Kendra Ballard MRN: [id number] DOB/AGE: 1923-08-15 80 y.o.  Admit date: 04/03/2014 Discharge date: 04/05/2014  Admission Diagnoses: Vulva cancer  Discharge Diagnoses:  Principal Problem:   Vulva cancer Active Problems:   Vulvar cancer   HTN (hypertension)   Syncope and collapse   Atrial fibrillation with rapid ventricular response ; Post Op   Pelvic mass in female   Discharged Condition:  The patient is in good condition and stable for discharge.   Hospital Course: On 04/03/2014, the patient underwent the following: Procedure(s):  MODIFIED LEFT POSTERIOR RADICAL VULVECTOMY.  Post-operatively, she was noted to be in atrial fibrillation with rapid ventricular response.  Cardiology was consulted with 2D echo with normal LVF and moderate AR/MR/TR.  She was discharged to home on postoperative day 2 tolerating a regular diet.  Consults: cardiology  Significant Diagnostic Studies: 2 D Echo  Treatments: cardiac meds: metoprolol  Discharge Exam: Blood pressure 99/50, pulse 75, temperature 98.3 F (36.8 C), temperature source Oral, resp. rate 20, height 5' (1.524 m), weight 116 lb (52.617 kg), SpO2 91.00%. General appearance: alert, cooperative and no distress Resp: clear to auscultation bilaterally Cardio: regular rate and rhythm, S1, S2 normal, no murmur, click, rub or gallop GI: soft, non-tender; bowel sounds normal; no masses,  no organomegaly Extremities: extremities normal, atraumatic, no cyanosis or edema Incision/Wound: Sutures to the left vulva intact with minimal amount of serosanguinous drainage present on peri-pad  Disposition: Home with Family  Discharge Orders   Future Appointments Provider Department Dept Phone   04/20/2014 3:00 PM [facility] [phone]   Liquids only 4 hours prior to your exam. Any medications can be taken as usual. Please arrive 15 min prior to your scheduled exam time.    04/27/2014 11:15 AM Alvino Chapel, MD [facility] Gynecological Oncology [phone]   Future Orders Complete By Expires   Call MD for:  difficulty breathing, headache or visual disturbances  As directed    Call MD for:  extreme fatigue  As directed    Call MD for:  hives  As directed    Call MD for:  persistant dizziness or light-headedness  As directed    Call MD for:  persistant nausea and vomiting  As directed    Call MD for:  redness, tenderness, or signs of infection (pain, swelling, redness, odor or green/yellow discharge around incision site)  As directed    Call MD for:  severe uncontrolled pain  As directed    Call MD for:  temperature >100.4  As directed    Diet - low sodium heart healthy  As directed    Driving Restrictions  As directed    Increase activity slowly  As directed    Lifting restrictions  As directed    Sexual Activity Restrictions  As directed        Medication List         aspirin-acetaminophen-caffeine 250-250-65 MG per tablet  Commonly known as:  EXCEDRIN MIGRAINE  Take 1 tablet by mouth every 6 (six) hours as needed for headache.     clonazePAM 0.5 MG tablet  Commonly known as:  KLONOPIN  Take 0.5 mg by mouth 2 (two) times daily.     hydrochlorothiazide 25 MG tablet  Commonly known as:  HYDRODIURIL  Take 25 mg by mouth daily. PT STATES SHE USUALLY ONLY TAKES EVERY OTHER DAY     multivitamin with minerals Tabs tablet  Take 1  tablet by mouth daily.     oxyCODONE-acetaminophen 5-325 MG per tablet  Commonly known as:  PERCOCET/ROXICET  Take 1-2 tablets by mouth every 4 (four) hours as needed for severe pain (moderate to severe pain (when tolerating fluids)).           Follow-up Information   Follow up with Alvino Chapel, MD On 04/27/2014. (at 11:15am at the [facility] for post-op follow up)    Specialty:  Gynecology   Contact information:   [address] [place] [id number] [phone]        Greater than thirty minutes were spend for face to face discharge instructions and discharge orders/summary in EPIC.   Signed: Dorothyann Gibbs 04/05/2014, 9:33 AM

## 2014-04-05 NOTE — Discharge Instructions (Addendum)
04/05/2014  Follow up with Cardiology as scheduled.  CT of the chest to follow up on nodular density noted on pre-op chest xray is ordered for 04/20/14 at 03:00pm.   Activity: 1. Be up and out of the bed during the day.  Take a nap if needed.  You may walk up steps but be careful and use the hand rail.  Stair climbing will tire you more than you think, you may need to stop part way and rest.   2. No lifting or straining for 4-6 weeks.  3. No driving for 1 week if applicable.  Do not drive if you are taking narcotic pain medicine.  4. Shower daily.  Use soap and water on your incision and pat dry; don't rub.  Avoid tub baths.  Use peri-bottle after urinating or having a bowel movement.  Keep the incision clean and dry.   5. No sexual activity and nothing in the vagina for 4 weeks.  Diet: 1. Low sodium Heart Healthy Diet is recommended.  2. It is safe to use a laxative if you have difficulty moving your bowels.   Wound Care: 1. Keep clean and dry.  Shower daily.  Reasons to call the Doctor:  Fever - Oral temperature greater than 100.4 degrees Fahrenheit  Foul-smelling vaginal discharge  Difficulty urinating  Nausea and vomiting  Increased pain at the site of the incision that is unrelieved with pain medicine.  Difficulty breathing with or without chest pain  New calf pain especially if only on one side  Sudden, continuing increased vaginal bleeding with or without clots.   Contacts: For questions or concerns you should contact:  Dr. Lahoma Crocker at [phone]  Dr. Fermin Schwab at [place]  Joylene John, NP at [phone]  [facility], Care After  The vulva is the external female genitalia, outside and around the vagina and pubic bone. It consists of:  The skin on, and in front of, the pubic bone.  The clitoris.  The labia majora (large lips) on the outside of the vagina.  The labia minora (small lips) around the opening of the  vagina.  The opening and the skin in and around the vagina. A vulvectomy is the removal of the tissue of the vulva, which sometimes includes removal of the lymph nodes and tissue in the groin areas. These discharge instructions provide you with general information on caring for yourself after you leave the hospital. It is also important that you know the warning signs of complications, so that you can seek treatment. Please read the instructions outlined below and refer to this sheet in the next few weeks. Your caregiver may also give you specific information and medicines. If you have any questions or complications after discharge, please call your caregiver. ACTIVITY  Rest as much as possible the first two weeks after discharge.  Arrange to have help from family or others with your daily activities when you go home.  Avoid heavy lifting (more than 5 pounds), pushing, or pulling.  If you feel tired, balance your activity with rest periods.  Follow your caregiver's instruction about climbing stairs and driving a car.  Increase activity gradually.  Do not exercise until you have permission from your caregiver. LEG AND FOOT CARE If your doctor has removed lymph nodes from your groin area, there may be an increase in swelling of your legs and feet. You can help prevent swelling by doing the following:  Elevate your legs while sitting or lying down.  If your  caregiver has ordered special stockings, wear them according to instructions.  Avoid standing in one place for long periods of time.  Call the physical therapy department if you have any questions about swelling or treatment for swelling.  Avoid salt in your diet. It can cause fluid retention and swelling.  Do not Dontreal Miera your legs, especially when sitting. NUTRITION  You may resume your normal diet.  Drink 6 to 8 glasses of fluids a day.  Eat a healthy, balanced diet including portions of food from the meat (protein), milk,  fruit, vegetable, and bread groups.  Your caregiver may recommend you take a multivitamin with iron. ELIMINATION  You may notice that your stream of urine is at a different angle, and may tend to spray. Using a plastic funnel may help to decrease urine spray.  If constipation occurs, drink more liquids, and add more fruits, vegetables, and bran to your diet. You may take a mild laxative, such as Milk of Magnesia, Metamucil or a stool softener, such as Colace with permission from your caregiver. HYGIENE  You may shower and wash your hair.  Check with your caregiver about tub baths.  Do not add any bath oils or chemicals to your bath water, after you have permission to take baths.  Clean yourself well after moving your bowels.  After urinating, wipe from top to bottom.  A sitz bath will help keep your perineal area clean, reduce swelling, and provide comfort. HOME CARE INSTRUCTIONS   Take your temperature twice a day and record it, especially if you feel feverish or have chills.  Follow your caregiver's instructions about medicines, activity, and follow-up appointments after surgery.  Do not drink alcohol while taking pain medicine.  Change your dressing as advised by your caregiver.  You may take over-the-counter medicine for pain, recommended by your caregiver.  If your pain is not relieved with medicine, call your caregiver.  Do not take aspirin, because it can cause bleeding.  Do not douche or use tampons (use a non-perfumed sanitary pad).  Do not have sexual intercourse until your caregiver gives you permission. Hugging, kissing, and playful sexual activity is fine with your caregiver's permission.  Warm sitz baths, with your caregiver's permission, are helpful to control swelling and discomfort.  Take showers instead of baths, until your caregiver gives you permission to take baths.  You may take a mild medicine for constipation, recommended by your caregiver. Bran  foods and drinking a lot of fluids will help with constipation.  Make sure your family understands everything about your operation and recovery. SEEK MEDICAL CARE IF:   You notice swelling and redness around the wound area.  You notice a foul smell coming from the wound or on the surgical dressing.  You notice the wound is separating.  You have painful or bloody urination.  You develop nausea and vomiting.  You develop diarrhea.  You develop a rash.  You have a reaction or allergy from the medicine.  You feel dizzy or light headed.  You need stronger pain medicine. SEEK IMMEDIATE MEDICAL CARE IF:   You develop a temperature of 102 F (38.9 C) or higher.  You pass out.  You develop leg or chest pain.  You develop abdominal pain.  You develop shortness of breath.  You develop bleeding from the wound area.  You see pus in the wound area. MAKE SURE YOU:   Understand these instructions.  Will watch your condition.  Will get help right away if you  are not doing well or get worse. Document Released: 07/14/2004 Document Revised: 09/20/2013 Document Reviewed: 11/01/2009 [facility] Patient Information 2014 [place], [place].  Acetaminophen; Oxycodone tablets What is this medicine? ACETAMINOPHEN; OXYCODONE (a set a MEE noe fen; ox i KOE done) is a pain reliever. It is used to treat mild to moderate pain. This medicine may be used for other purposes; ask your health care provider or pharmacist if you have questions. COMMON BRAND NAME(S): Endocet, Magnacet, Narvox, Percocet, Perloxx, Primalev, Primlev, Roxicet, Xolox What should I tell my health care provider before I take this medicine? They need to know if you have any of these conditions: -brain tumor -Crohn's disease, inflammatory bowel disease, or ulcerative colitis -drug abuse or addiction -head injury -heart or circulation problems -if you often drink alcohol -kidney disease or problems going to the  bathroom -liver disease -lung disease, asthma, or breathing problems -an unusual or allergic reaction to acetaminophen, oxycodone, other opioid analgesics, other medicines, foods, dyes, or preservatives -pregnant or trying to get pregnant -breast-feeding How should I use this medicine? Take this medicine by mouth with a full glass of water. Follow the directions on the prescription label. Take your medicine at regular intervals. Do not take your medicine more often than directed. Talk to your pediatrician regarding the use of this medicine in children. Special care may be needed. Patients over 63 years old may have a stronger reaction and need a smaller dose. Overdosage: If you think you have taken too much of this medicine contact a poison control center or emergency room at once. NOTE: This medicine is only for you. Do not share this medicine with others. What if I miss a dose? If you miss a dose, take it as soon as you can. If it is almost time for your next dose, take only that dose. Do not take double or extra doses. What may interact with this medicine? -alcohol -antihistamines -barbiturates like amobarbital, butalbital, butabarbital, methohexital, pentobarbital, phenobarbital, thiopental, and secobarbital -benztropine -drugs for bladder problems like solifenacin, trospium, oxybutynin, tolterodine, hyoscyamine, and methscopolamine -drugs for breathing problems like ipratropium and tiotropium -drugs for certain stomach or intestine problems like propantheline, homatropine methylbromide, glycopyrrolate, atropine, belladonna, and dicyclomine -general anesthetics like etomidate, ketamine, nitrous oxide, propofol, desflurane, enflurane, halothane, isoflurane, and sevoflurane -medicines for depression, anxiety, or psychotic disturbances -medicines for sleep -muscle relaxants -naltrexone -narcotic medicines (opiates) for pain -phenothiazines like perphenazine, thioridazine, chlorpromazine,  mesoridazine, fluphenazine, prochlorperazine, promazine, and trifluoperazine -scopolamine -tramadol -trihexyphenidyl This list may not describe all possible interactions. Give your health care provider a list of all the medicines, herbs, non-prescription drugs, or dietary supplements you use. Also tell them if you smoke, drink alcohol, or use illegal drugs. Some items may interact with your medicine. What should I watch for while using this medicine? Tell your doctor or health care professional if your pain does not go away, if it gets worse, or if you have new or a different type of pain. You may develop tolerance to the medicine. Tolerance means that you will need a higher dose of the medication for pain relief. Tolerance is normal and is expected if you take this medicine for a long time. Do not suddenly stop taking your medicine because you may develop a severe reaction. Your body becomes used to the medicine. This does NOT mean you are addicted. Addiction is a behavior related to getting and using a drug for a non-medical reason. If you have pain, you have a medical reason to take pain  medicine. Your doctor will tell you how much medicine to take. If your doctor wants you to stop the medicine, the dose will be slowly lowered over time to avoid any side effects. You may get drowsy or dizzy. Do not drive, use machinery, or do anything that needs mental alertness until you know how this medicine affects you. Do not stand or sit up quickly, especially if you are an older patient. This reduces the risk of dizzy or fainting spells. Alcohol may interfere with the effect of this medicine. Avoid alcoholic drinks. There are different types of narcotic medicines (opiates) for pain. If you take more than one type at the same time, you may have more side effects. Give your health care provider a list of all medicines you use. Your doctor will tell you how much medicine to take. Do not take more medicine than  directed. Call emergency for help if you have problems breathing. The medicine will cause constipation. Try to have a bowel movement at least every 2 to 3 days. If you do not have a bowel movement for 3 days, call your doctor or health care professional. Do not take Tylenol (acetaminophen) or medicines that have acetaminophen with this medicine. Too much acetaminophen can be very dangerous. Many nonprescription medicines contain acetaminophen. Always read the labels carefully to avoid taking more acetaminophen. What side effects may I notice from receiving this medicine? Side effects that you should report to your doctor or health care professional as soon as possible: -allergic reactions like skin rash, itching or hives, swelling of the face, lips, or tongue -breathing difficulties, wheezing -confusion -light headedness or fainting spells -severe stomach pain -unusually weak or tired -yellowing of the skin or the whites of the eyes  Side effects that usually do not require medical attention (report to your doctor or health care professional if they continue or are bothersome): -dizziness -drowsiness -nausea -vomiting This list may not describe all possible side effects. Call your doctor for medical advice about side effects. You may report side effects to FDA at 1-800-FDA-1088. Where should I keep my medicine? Keep out of the reach of children. This medicine can be abused. Keep your medicine in a safe place to protect it from theft. Do not share this medicine with anyone. Selling or giving away this medicine is dangerous and against the law. Store at room temperature between 20 and 25 degrees C (68 and 77 degrees F). Keep container tightly closed. Protect from light. This medicine may cause accidental overdose and death if it is taken by other adults, children, or pets. Flush any unused medicine down the toilet to reduce the chance of harm. Do not use the medicine after the expiration  date. NOTE: This sheet is a summary. It may not cover all possible information. If you have questions about this medicine, talk to your doctor, pharmacist, or health care provider.  2014, [employer]. (2013-07-24 13:17:35)

## 2014-04-05 NOTE — Progress Notes (Signed)
  SUBJECTIVE:  No complaints  OBJECTIVE:   Vitals:   Filed Vitals:   04/04/14 1207 04/04/14 1658 04/04/14 2016 04/05/14 0559  BP: 109/52 97/52 99/47  99/50  Pulse: 87 78 80 75  Temp: 98.4 F (36.9 C) 98.2 F (36.8 C) 98.3 F (36.8 C) 98.3 F (36.8 C)  TempSrc: Oral Oral Oral Oral  Resp: 17 16 20 20   Height:      Weight:      SpO2: 99% 95% 91% 91%     PHYSICAL EXAM General: Well developed, well nourished, in no acute distress Head: Eyes PERRLA, No xanthomas.   Normal cephalic and atramatic  Lungs:   Clear bilaterally to auscultation and percussion. Heart:   HRRR S1 S2 Pulses are 2+ & equal. Abdomen: Bowel sounds are positive, abdomen soft and non-tender without masses Extremities:   No clubbing, cyanosis or edema.  DP +1 Neuro: Alert and oriented X 3. Psych:  Good affect, responds appropriately     RADIOLOGY: Chest 2 View  03/30/2014   CLINICAL DATA:  Preoperative evaluation for vulvar surgery  EXAM: CHEST  2 VIEW  COMPARISON:  10/15/2004  FINDINGS: Cardiac shadow is within normal limits. The lungs are well aerated bilaterally. A questionable nodular density is noted adjacent to the cardiac apex. This is not well appreciated on the lateral projection. No focal infiltrate or sizable effusion is noted.  IMPRESSION: Possible nodular density along the left heart border. Noncontrast CT of the chest is recommended for further evaluation.   Electronically Signed   By: Inez Catalina M.D.   On: 03/30/2014 11:33   IMPRESSION:  Principal Problem:  Vulva cancer  Active Problems:  Syncope and collapse  Atrial fibrillation with rapid ventricular response ; Post Op- converted to NSR at 1:46am yesterday;with a 3 second post termination pause - 2D echo with normal LVF and moderate AR/MR/TR  HTN (hypertension) - borderline low.  Diuretic stopped  RECOMMENDATION:   Would not initiate anticoagulation for now as she is post-op & s/p significant fall. -- will need to readdress this prior to d/c,  but would anticipate not initiating Rx until OP ROV  Will need OP Monitor to assess Afib Burden & also to look for Bradycardia/post termination pauses from PAF or other rapid arrythmia as culprit for Syncope.  My office will place this today prior to discharge. Followup with Dr. Ellyn Hack in 1-2 weeks   Kendra Margarita, MD  04/05/2014  8:08 AM

## 2014-04-06 ENCOUNTER — Telehealth: Payer: Self-pay | Admitting: Cardiology

## 2014-04-09 NOTE — Telephone Encounter (Signed)
Closed encounter °

## 2014-04-11 ENCOUNTER — Ambulatory Visit (INDEPENDENT_AMBULATORY_CARE_PROVIDER_SITE_OTHER): Payer: Medicare Other | Admitting: *Deleted

## 2014-04-11 DIAGNOSIS — R001 Bradycardia, unspecified: Secondary | ICD-10-CM

## 2014-04-11 DIAGNOSIS — R55 Syncope and collapse: Secondary | ICD-10-CM

## 2014-04-11 DIAGNOSIS — I4891 Unspecified atrial fibrillation: Secondary | ICD-10-CM

## 2014-04-11 DIAGNOSIS — I498 Other specified cardiac arrhythmias: Secondary | ICD-10-CM

## 2014-04-11 NOTE — Patient Instructions (Signed)
Cardiac Event Monitoring A cardiac event monitor is a small recording device used to help detect abnormal heart rhythms (arrhythmias). The monitor is used to record heart rhythm when noticeable symptoms such as the following occur:  Fast heart beats (palpitations), such as heart racing or fluttering.  Dizziness.  Fainting or lightheadedness.  Unexplained weakness. The monitor is wired to two electrodes placed on your chest. Electrodes are flat, sticky disks that attach to your skin. The monitor can be worn for up to 30 days. You will wear the monitor at all times, except when bathing.  HOW TO USE YOUR CARDIAC EVENT MONITOR A technician will prepare your chest for the electrode placement. The technician will show you how to place the electrodes, how to work the monitor, and how to replace the batteries. Take time to practice using the monitor before you leave the office. Make sure you understand how to send the information from the monitor to your health care provider. This requires a telephone with a landline, not a cellphone. You need to:  Wear your monitor at all times, except when you are in water:  Do not get the monitor wet.  Take the monitor off when bathing. Do not swim or use a hot tub with it on.  Keep your skin clean. Do not put body lotion or moisturizer on your chest.  Change the electrodes daily or any time they stop sticking to your skin. You might need to use tape to keep them on.  It is possible that your skin under the electrodes could become irritated. To keep this from happening, try to put the electrodes in slightly different places on your chest. However, they must remain in the area under your left breast and in the upper right section of your chest.  Make sure the monitor is safely clipped to your clothing or in a location close to your body that your health care provider recommends.  Press the button to record when you feel symptoms of heart trouble, such as  dizziness, weakness, lightheadedness, palpitations, thumping, shortness of breath, unexplained weakness, or a fluttering or racing heart. The monitor is always on and records what happened slightly before you pressed the button, so do not worry about being too late to get good information.  Keep a diary of your activities, such as walking, doing chores, and taking medicine. It is especially important to note what you were doing when you pushed the button to record your symptoms. This will help your health care provider determine what might be contributing to your symptoms. The information stored in your monitor will be reviewed by your health care provider alongside your diary entries.  Send the recorded information as recommended by your health care provider. It is important to understand that it will take some time for your health care provider to process the results.  Change the batteries as recommended by your health care provider. SEEK IMMEDIATE MEDICAL CARE IF:   You have chest pain.  You have extreme difficulty breathing or shortness of breath.  You develop a very fast heartbeat that persists.  You develop dizziness that does not go away .  You faint or constantly feel you are about to faint. Document Released: 09/08/2008 Document Revised: 08/02/2013 Document Reviewed: 05/29/2013 [facility] Patient Information 2014 [place], [place].   ** you will wear this for 30 days - your last day will be May 29 ** when you open the 2nd packet of electrodes (stickers) and batteries, call the 800 number  on the back of the booklet to send you some more ** if the stickers irritate your skin, do not hesitate to call the company to send you sensitive skin stickers ** change the battery and stickers every day ** remove the stickers and senor (lanyard that hangs around neck) when you bathe and replace with new stickers after ** the monitor (balck rectangle box that you record symptoms with) can sense up to  100 feet, so if you are out and about, keep it nearby ** pack everything up in the silver box when finished and call [employer] to pick it up or drop off at nearest location

## 2014-04-20 ENCOUNTER — Ambulatory Visit (HOSPITAL_COMMUNITY): Payer: Medicare Other

## 2014-04-27 ENCOUNTER — Encounter: Payer: Self-pay | Admitting: Gynecology

## 2014-04-27 ENCOUNTER — Ambulatory Visit: Payer: Medicare Other | Attending: Gynecology | Admitting: Gynecology

## 2014-04-27 VITALS — BP 139/70 | HR 81 | Temp 97.5°F | Resp 18 | Ht 60.0 in | Wt 112.3 lb

## 2014-04-27 DIAGNOSIS — Z79899 Other long term (current) drug therapy: Secondary | ICD-10-CM | POA: Insufficient documentation

## 2014-04-27 DIAGNOSIS — F411 Generalized anxiety disorder: Secondary | ICD-10-CM | POA: Insufficient documentation

## 2014-04-27 DIAGNOSIS — C519 Malignant neoplasm of vulva, unspecified: Secondary | ICD-10-CM | POA: Insufficient documentation

## 2014-04-27 DIAGNOSIS — I10 Essential (primary) hypertension: Secondary | ICD-10-CM | POA: Insufficient documentation

## 2014-04-27 NOTE — Progress Notes (Signed)
Consult Note: Gyn-Onc   Kendra Ballard 80 y.o. female  Chief Complaint  Patient presents with  . 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 Doing well.  Plan:  The patient will continue local wound care and we expect further and complete epithelialization over the granulation tissue. Return to see me in 4-6 weeks.  Interval history: The patient underwent a modified posterior radical vulvectomy on 04/03/2014. Postoperative course was uncomplicated. Final pathology showed an invasive squamous cell carcinoma with negative margins. Since hospital discharge the patient reports that her pain has completely resolved. She denies any bleeding or discharge. She's been somewhat weak but is gradually feeling stronger day by day.  HPI:  80 year old white female seen in consultation at the request of Dr.Hjerpe regarding management of a newly diagnosed recurrent vulvar carcinoma.  Patient's history dates back to 2005 when she had a stage II carcinoma of the left vulva. At that time she underwent a left modified radical vulvectomy with rhomboid flap reconstruction and left inguinal lymphadenectomy. Margins and lymph nodes were negative.  Patient had a recurrence on the right vulva in 2010 and underwent a modified right radical vulvectomy and right inguinal lymphadenectomy at [facility]. Once again margins and lymph nodes were negative.  Patient gives a history of approximate 6 months of a painful lesion on the left vulva.  She underwent a left posterior modified radical vulvectomy on 04/03/2014. Final pathology showed negative margins.  Review of Systems:10 point review of systems is negative except as noted in interval history.   Vitals: Blood pressure 139/70, pulse 81, temperature 97.5 F (36.4 C), temperature source Oral, resp. rate 18, height 5' (1.524 m), weight 112 lb 4.8 oz (50.939 kg).  Physical Exam: General :  The patient is a healthy woman in no acute distress.  HEENT: normocephalic, extraoccular movements normal; neck is supple without thyromegally  Lynphnodes: Supraclavicular and inguinal nodes not enlarged  Abdomen: Soft, non-tender, no ascites, no organomegally, no masses, no hernias  Pelvic:  EGBUS: Normal female . There is granulation tissue on the left posterior vulva consistent with a wound separation. The granulation tissues healthy. Vagina: Normal, no lesions , atrophic Urethra and Bladder: Normal, non-tender  Cervix: Surgically absent  Uterus: Surgically absent  Bi-manual examination: Non-tender; no adenxal masses or nodularity  Rectal: normal sphincter tone, no masses, no blood  Lower extremities: No edema or varicosities. Normal range of motion      Allergies  Allergen Reactions  . Penicillins Rash    Past Medical History  Diagnosis Date  . Recurrent squamous cell carcinoma of vulva   . Lichen sclerosus   . Hypertension   . Urge incontinence   . Anxiety   . Arthritis     HANDS    Past Surgical History  Procedure Laterality Date  . Radical vulvectomy  2005    left modified radical vulvectomy, L inguinal lymphadenectomy, rhomboid flap reconstruction by Dr. Fermin Schwab in [place]  . Hernia repair    . Appendectomy    . Vaginal hysterectomy  03/2007    without LSO  . Radical vulvectomy  04/10/2009    at [facility] for recurrent SCC of the vulva, bilateral inguinal node dissection  . Vulvectomy Left 04/03/2014    Procedure: MODIFIED LEFT POSTERIOR RADICAL VULVECTOMY;  Surgeon: Alvino Chapel, MD;  Location: [facility];  Service: Gynecology;  Laterality: Left;    Current Outpatient Prescriptions  Medication Sig  Dispense Refill  . aspirin-acetaminophen-caffeine (EXCEDRIN MIGRAINE) 250-250-65 MG per tablet Take 1 tablet by mouth every 6 (six) hours as needed for headache.      . clonazePAM (KLONOPIN) 0.5 MG tablet Take 0.5 mg by mouth 2 (two) times daily.        . hydrochlorothiazide (HYDRODIURIL) 25 MG tablet Take 25 mg by mouth daily. PT STATES SHE USUALLY ONLY TAKES EVERY OTHER DAY      . Multiple Vitamin (MULTIVITAMIN WITH MINERALS) TABS tablet Take 1 tablet by mouth daily.      Marland Kitchen oxyCODONE-acetaminophen (PERCOCET/ROXICET) 5-325 MG per tablet Take 1-2 tablets by mouth every 4 (four) hours as needed for severe pain (moderate to severe pain (when tolerating fluids)).  30 tablet  0  . PREVNAR 13 SUSP injection        No current facility-administered medications for this visit.    History   Social History  . Marital Status: Widowed    Spouse Name: N/A    Number of Children: N/A  . Years of Education: N/A   Occupational History  . Not on file.   Social History Main Topics  . Smoking status: Never Smoker   . Smokeless tobacco: Not on file  . Alcohol Use: No  . Drug Use: No  . Sexual Activity: Not on file   Other Topics Concern  . Not on file   Social History Narrative  . No narrative on file    Family History  Problem Relation Age of Onset  . Hypertension Sister       Alvino Chapel, MD 04/27/2014, 11:28 AM

## 2014-04-27 NOTE — Patient Instructions (Signed)
Pt to follow up in 4-6weeks

## 2014-05-04 ENCOUNTER — Encounter (HOSPITAL_COMMUNITY): Payer: Self-pay

## 2014-05-04 ENCOUNTER — Telehealth: Payer: Self-pay | Admitting: *Deleted

## 2014-05-04 ENCOUNTER — Ambulatory Visit (HOSPITAL_COMMUNITY)
Admission: RE | Admit: 2014-05-04 | Discharge: 2014-05-04 | Disposition: A | Discharge status: Home / Self Care | Payer: Medicare Other | Source: Ambulatory Visit | Attending: Gynecology | Admitting: Gynecology

## 2014-05-04 DIAGNOSIS — R911 Solitary pulmonary nodule: Secondary | ICD-10-CM | POA: Insufficient documentation

## 2014-05-04 DIAGNOSIS — Z8544 Personal history of malignant neoplasm of other female genital organs: Secondary | ICD-10-CM | POA: Insufficient documentation

## 2014-05-04 DIAGNOSIS — R9389 Abnormal findings on diagnostic imaging of other specified body structures: Secondary | ICD-10-CM

## 2014-05-04 NOTE — Telephone Encounter (Signed)
Received call report for CT scan results done today from [facility] @ [facility].  Printed report and gave to Armanda Magic, RN for GYN dept.  Mary sent report to Dr. Fermin Schwab via email. Cassandra's phone    [phone].

## 2014-05-08 ENCOUNTER — Other Ambulatory Visit: Payer: Self-pay | Admitting: *Deleted

## 2014-05-08 DIAGNOSIS — R911 Solitary pulmonary nodule: Secondary | ICD-10-CM

## 2014-05-09 ENCOUNTER — Telehealth: Payer: Self-pay | Admitting: *Deleted

## 2014-05-09 NOTE — Telephone Encounter (Signed)
Called notified pt appt with Dr. Prescott Gum regarding recent CT scan. Pt given directions and appt date/time. Pt verbalized understanding. No further concerns.

## 2014-05-16 ENCOUNTER — Encounter: Payer: Medicare Other | Admitting: Cardiothoracic Surgery

## 2014-05-18 ENCOUNTER — Encounter: Payer: Medicare Other | Admitting: Cardiothoracic Surgery

## 2014-05-21 ENCOUNTER — Encounter: Payer: Medicare Other | Admitting: Cardiothoracic Surgery

## 2014-05-21 ENCOUNTER — Encounter: Payer: Self-pay | Admitting: Cardiology

## 2014-05-21 ENCOUNTER — Ambulatory Visit (INDEPENDENT_AMBULATORY_CARE_PROVIDER_SITE_OTHER): Payer: Medicare Other | Admitting: Cardiology

## 2014-05-21 VITALS — BP 118/72 | HR 78 | Ht 60.0 in | Wt 114.0 lb

## 2014-05-21 DIAGNOSIS — I4891 Unspecified atrial fibrillation: Secondary | ICD-10-CM

## 2014-05-21 DIAGNOSIS — R55 Syncope and collapse: Secondary | ICD-10-CM

## 2014-05-21 MED ORDER — APIXABAN 5 MG PO TABS: 5.0000 mg | ORAL_TABLET | Freq: Two times a day (BID) | ORAL | Status: DC

## 2014-05-21 NOTE — Patient Instructions (Signed)
   Begin Eliquis 5mg  twice a day    Samples, free 30-day trial card given with printed script, & regular printed script given today Continue all other medications.   Lab for [facility]  Office will contact with results via phone or letter.   Your physician wants you to follow up in: 6 months.  You will receive a reminder letter in the mail one-two months in advance.  If you don't receive a letter, please call our office to schedule the follow up appointment

## 2014-05-21 NOTE — Progress Notes (Signed)
Clinical Summary Ms. Ulloa is a 80 y.o.female seen today as a hospital follow up appointment. This is our first visit together.  1. Afib - patient with history of vulvectomy due to vulvar cancer 03/2014 - patient actually passed out in shower prior to surgery, no reported prodrome.  - post-op developed afib with RVR. This was controlled with IV dilt and metop - echo showed normal LVEF 60-65%, moderate MR, AI, and TR.  - outpatient 30 day monitor was arranged, 12 afib episodes longest 10 mintes with highest rate 105 bpm and lowest rate 60 bpm. Lowest HR 40 in early AM hours  - no prior history of syncope. No repeat episodes. Denies any lightheadedness or dizziness. Denies any palpitations.   Past Medical History  Diagnosis Date  . Lichen sclerosus   . Hypertension   . Urge incontinence   . Anxiety   . Arthritis     HANDS  . Recurrent squamous cell carcinoma of vulva      Allergies  Allergen Reactions  . Penicillins Rash     Current Outpatient Prescriptions  Medication Sig Dispense Refill  . aspirin-acetaminophen-caffeine (EXCEDRIN MIGRAINE) 250-250-65 MG per tablet Take 1 tablet by mouth every 6 (six) hours as needed for headache.      . clonazePAM (KLONOPIN) 0.5 MG tablet Take 0.5 mg by mouth 2 (two) times daily.       . hydrochlorothiazide (HYDRODIURIL) 25 MG tablet Take 25 mg by mouth daily. PT STATES SHE USUALLY ONLY TAKES EVERY OTHER DAY      . Multiple Vitamin (MULTIVITAMIN WITH MINERALS) TABS tablet Take 1 tablet by mouth daily.      Marland Kitchen oxyCODONE-acetaminophen (PERCOCET/ROXICET) 5-325 MG per tablet Take 1-2 tablets by mouth every 4 (four) hours as needed for severe pain (moderate to severe pain (when tolerating fluids)).  30 tablet  0  . PREVNAR 13 SUSP injection        No current facility-administered medications for this visit.     Past Surgical History  Procedure Laterality Date  . Radical vulvectomy  2005    left modified radical vulvectomy, L inguinal  lymphadenectomy, rhomboid flap reconstruction by Dr. Fermin Schwab in [place]  . Hernia repair    . Appendectomy    . Vaginal hysterectomy  03/2007    without LSO  . Radical vulvectomy  04/10/2009    at [facility] for recurrent SCC of the vulva, bilateral inguinal node dissection  . Vulvectomy Left 04/03/2014    Procedure: MODIFIED LEFT POSTERIOR RADICAL VULVECTOMY;  Surgeon: Alvino Chapel, MD;  Location: [facility];  Service: Gynecology;  Laterality: Left;     Allergies  Allergen Reactions  . Penicillins Rash      Family History  Problem Relation Age of Onset  . Hypertension Sister      Social History Ms. Darius reports that she has never smoked. She does not have any smokeless tobacco history on file. Ms. Forrey reports that she does not drink alcohol.   Review of Systems CONSTITUTIONAL: No weight loss, fever, chills, weakness or fatigue.  HEENT: Eyes: No visual loss, blurred vision, double vision or yellow sclerae.No hearing loss, sneezing, congestion, runny nose or sore throat.  SKIN: No rash or itching.  CARDIOVASCULAR: per HPI RESPIRATORY: No shortness of breath, cough or sputum.  GASTROINTESTINAL: No anorexia, nausea, vomiting or diarrhea. No abdominal pain or blood.  GENITOURINARY: No burning on urination, no polyuria NEUROLOGICAL: No headache, dizziness, syncope, paralysis, ataxia, numbness or tingling in the  extremities. No change in bowel or bladder control.  MUSCULOSKELETAL: No muscle, back pain, joint pain or stiffness.  LYMPHATICS: No enlarged nodes. No history of splenectomy.  PSYCHIATRIC: No history of depression or anxiety.  ENDOCRINOLOGIC: No reports of sweating, cold or heat intolerance. No polyuria or polydipsia.  Marland Kitchen   Physical Examination p 78 bp 118/72 Wt 114 lbs BMI 22 Gen: resting comfortably, no acute distress HEENT: no scleral icterus, pupils equal round and reactive, no palptable cervical adenopathy,  CV: RRR, no m/r/g, no JVD Resp:  Clear to auscultation bilaterally GI: abdomen is soft, non-tender, non-distended, normal bowel sounds, no hepatosplenomegaly MSK: extremities are warm, no edema.  Skin: warm, no rash Neuro:  no focal deficits Psych: appropriate affect   Diagnostic Studies 04/03/14 Echo Study Conclusions  - Left ventricle: The cavity size was normal. Systolic function was normal. The estimated ejection fraction was in the range of 60% to 65%. Wall motion was normal; there were no regional wall motion abnormalities. The study was not technically sufficient to allow evaluation of LV diastolic dysfunction due to atrial fibrillation. - Aortic valve: Moderate regurgitation. - Aortic root: The aortic root was normal in size. - Mitral valve: Calcified annulus. Mildly thickened leaflets . Moderate regurgitation. - Left atrium: The atrium was mildly dilated. - Right ventricle: Systolic function was normal. - Tricuspid valve: Moderate regurgitation. - Pulmonary arteries: Systolic pressure was within the normal range. PA peak pressure: 79mm Hg (S). - Inferior vena cava: The vessel was normal in size. - Pericardium, extracardiac: There was no pericardial effusion.      Assessment and Plan   1. Afib - CHADS2Vasc is 4, will start eliquis - check TSH as does not appear was done in hospita - not currently requiring any AV nodal agents. On monitor highest afib rates were around 105, low heart rates at times in early morning hours in 40s   F/u 6 months  Arnoldo Lenis, M.D., F.A.C.C.

## 2014-05-22 ENCOUNTER — Telehealth: Payer: Self-pay | Admitting: *Deleted

## 2014-05-22 MED ORDER — APIXABAN 5 MG PO TABS: 5.0000 mg | ORAL_TABLET | Freq: Two times a day (BID) | ORAL | Status: DC

## 2014-05-22 NOTE — Telephone Encounter (Signed)
Form for eliquis in bin

## 2014-05-23 NOTE — Telephone Encounter (Signed)
Faxed

## 2014-05-24 ENCOUNTER — Telehealth: Payer: Self-pay | Admitting: *Deleted

## 2014-05-24 NOTE — Addendum Note (Signed)
Addended by: Fernande Boyden on: 05/24/2014 12:10 PM   Modules accepted: Level of Service, SmartSet

## 2014-05-24 NOTE — Telephone Encounter (Signed)
This encounter was created in error - please disregard.

## 2014-05-24 NOTE — Telephone Encounter (Signed)
PA for patients MAXZIDE completed through [email or web address]

## 2014-05-30 ENCOUNTER — Encounter: Payer: Self-pay | Admitting: Gynecology

## 2014-05-30 ENCOUNTER — Ambulatory Visit: Payer: Medicare Other | Attending: Gynecology | Admitting: Gynecology

## 2014-05-30 VITALS — BP 144/78 | HR 89 | Temp 97.8°F | Resp 18 | Ht 60.2 in | Wt 113.9 lb

## 2014-05-30 DIAGNOSIS — F411 Generalized anxiety disorder: Secondary | ICD-10-CM | POA: Insufficient documentation

## 2014-05-30 DIAGNOSIS — Z79899 Other long term (current) drug therapy: Secondary | ICD-10-CM | POA: Insufficient documentation

## 2014-05-30 DIAGNOSIS — C519 Malignant neoplasm of vulva, unspecified: Secondary | ICD-10-CM | POA: Insufficient documentation

## 2014-05-30 DIAGNOSIS — R911 Solitary pulmonary nodule: Secondary | ICD-10-CM

## 2014-05-30 DIAGNOSIS — I10 Essential (primary) hypertension: Secondary | ICD-10-CM | POA: Insufficient documentation

## 2014-05-30 NOTE — Patient Instructions (Signed)
You will follow up with Dr. Prescott Gum on 6/24 at 3:30pm  You will have surgery with Dr. Fermin Schwab on 6/30 at [facility]

## 2014-05-30 NOTE — Progress Notes (Signed)
Consult Note: Gyn-Onc   Kendra Ballard 80 y.o. female  Chief Complaint  Patient presents with  . Vulvar Cancer    Follow up     Assessment : Recurrent carcinoma the vulva   Plan:  We will schedule the patient to undergo resection of the new perineal lesion on June 30 at 7:30 AM.. Risks of surgery including infection and wound breakdown were discussed the patient and her neice. All questions are answered.  Interval history: The patient returns today as scheduled but notes that she has a new painful area on the vulva. She denies any bleeding. She reports she is fatigued.  On recent CT scan she was found to have a lung lesion but she is yet to make an appointment to be evaluated.  HPI:  80 year old white female seen in consultation at the request of Dr.Hjerpe regarding management of a newly diagnosed recurrent vulvar carcinoma.  Patient's history dates back to 2005 when she had a stage II carcinoma of the left vulva. At that time she underwent a left modified radical vulvectomy with rhomboid flap reconstruction and left inguinal lymphadenectomy. Margins and lymph nodes were negative.  Patient had a recurrence on the right vulva in 2010 and underwent a modified right radical vulvectomy and right inguinal lymphadenectomy at [facility]. Once again margins and lymph nodes were negative.  Patient gives a history of approximate 6 months of a painful lesion on the left vulva.  She underwent a left posterior modified radical vulvectomy on 04/03/2014. Final pathology showed negative margins.  Review of Systems:10 point review of systems is negative except as noted in interval history.   Vitals: Blood pressure 144/78, pulse 89, temperature 97.8 F (36.6 C), temperature source Oral, resp. rate 18, height 5' 0.2" (1.529 m), weight 113 lb 14.4 oz (51.665 kg).  Physical Exam: General : The patient is a healthy woman in no acute distress.  HEENT: normocephalic, extraoccular  movements normal; neck is supple without thyromegally  Lynphnodes: Supraclavicular and inguinal nodes not enlarged  Abdomen: Soft, non-tender, no ascites, no organomegally, no masses, no hernias  Pelvic:  EGBUS: Normal female . The the previous surgical site is well healed. However in the midline is a 1 cm granular exceedingly painful lesion break close to the anus. Vagina: Normal, no lesions , atrophic Urethra and Bladder: Normal, non-tender  Cervix: Surgically absent  Uterus: Surgically absent  Bi-manual examination: Non-tender; no adenxal masses or nodularity  Rectal: normal sphincter tone, no masses, no blood  Lower extremities: No edema or varicosities. Normal range of motion      Allergies  Allergen Reactions  . Penicillins Rash    Past Medical History  Diagnosis Date  . Lichen sclerosus   . Hypertension   . Urge incontinence   . Anxiety   . Arthritis     HANDS  . Recurrent squamous cell carcinoma of vulva     Past Surgical History  Procedure Laterality Date  . Radical vulvectomy  2005    left modified radical vulvectomy, L inguinal lymphadenectomy, rhomboid flap reconstruction by Dr. Fermin Schwab in [place]  . Hernia repair    . Appendectomy    . Vaginal hysterectomy  03/2007    without LSO  . Radical vulvectomy  04/10/2009    at [facility] for recurrent SCC of the vulva, bilateral inguinal node dissection  . Vulvectomy Left 04/03/2014    Procedure: MODIFIED LEFT POSTERIOR RADICAL VULVECTOMY;  Surgeon: Alvino Chapel, MD;  Location: [facility];  Service:  Gynecology;  Laterality: Left;    Current Outpatient Prescriptions  Medication Sig Dispense Refill  . apixaban (ELIQUIS) 5 MG TABS tablet Take 1 tablet (5 mg total) by mouth 2 (two) times daily.  14 tablet  0  . aspirin-acetaminophen-caffeine (EXCEDRIN MIGRAINE) 924-268-34 MG per tablet Take 1 tablet by mouth every 6 (six) hours as needed for headache.      . clonazePAM (KLONOPIN) 0.5 MG tablet Take  0.5 mg by mouth 2 (two) times daily.       . hydrochlorothiazide (HYDRODIURIL) 25 MG tablet Take 25 mg by mouth daily. PT STATES SHE USUALLY ONLY TAKES EVERY OTHER DAY      . Multiple Vitamin (MULTIVITAMIN WITH MINERALS) TABS tablet Take 1 tablet by mouth daily. Centrum silver      . PREVNAR 13 SUSP injection Inject 0.5 mLs into the muscle. Treat pneumonia       No current facility-administered medications for this visit.    History   Social History  . Marital Status: Widowed    Spouse Name: N/A    Number of Children: N/A  . Years of Education: N/A   Occupational History  . Not on file.   Social History Main Topics  . Smoking status: Never Smoker   . Smokeless tobacco: Not on file  . Alcohol Use: No  . Drug Use: No  . Sexual Activity: No   Other Topics Concern  . Not on file   Social History Narrative  . No narrative on file    Family History  Problem Relation Age of Onset  . Hypertension Sister       Alvino Chapel, MD 05/30/2014, 1:28 PM

## 2014-06-01 ENCOUNTER — Encounter (HOSPITAL_COMMUNITY): Payer: Self-pay | Admitting: Pharmacy Technician

## 2014-06-04 ENCOUNTER — Encounter: Payer: Self-pay | Admitting: *Deleted

## 2014-06-06 ENCOUNTER — Ambulatory Visit (HOSPITAL_COMMUNITY)
Admission: RE | Admit: 2014-06-06 | Discharge: 2014-06-06 | Disposition: A | Discharge status: Home / Self Care | Payer: Medicare Other | Source: Ambulatory Visit | Attending: Gynecology | Admitting: Gynecology

## 2014-06-06 ENCOUNTER — Encounter: Payer: Self-pay | Admitting: Cardiothoracic Surgery

## 2014-06-06 ENCOUNTER — Institutional Professional Consult (permissible substitution) (INDEPENDENT_AMBULATORY_CARE_PROVIDER_SITE_OTHER): Payer: Medicare Other | Admitting: Cardiothoracic Surgery

## 2014-06-06 ENCOUNTER — Encounter (HOSPITAL_COMMUNITY)
Admission: RE | Admit: 2014-06-06 | Discharge: 2014-06-06 | Disposition: A | Discharge status: Home / Self Care | Payer: Medicare Other | Source: Ambulatory Visit | Attending: Cardiothoracic Surgery | Admitting: Cardiothoracic Surgery

## 2014-06-06 ENCOUNTER — Encounter (HOSPITAL_COMMUNITY): Payer: Self-pay

## 2014-06-06 VITALS — BP 147/85 | HR 86 | Resp 16 | Ht 62.0 in | Wt 113.0 lb

## 2014-06-06 DIAGNOSIS — Z01812 Encounter for preprocedural laboratory examination: Secondary | ICD-10-CM | POA: Insufficient documentation

## 2014-06-06 DIAGNOSIS — J984 Other disorders of lung: Secondary | ICD-10-CM | POA: Insufficient documentation

## 2014-06-06 DIAGNOSIS — C519 Malignant neoplasm of vulva, unspecified: Secondary | ICD-10-CM | POA: Insufficient documentation

## 2014-06-06 DIAGNOSIS — Z01818 Encounter for other preprocedural examination: Secondary | ICD-10-CM | POA: Insufficient documentation

## 2014-06-06 DIAGNOSIS — R918 Other nonspecific abnormal finding of lung field: Secondary | ICD-10-CM

## 2014-06-06 HISTORY — DX: Cardiac arrhythmia, unspecified: I49.9

## 2014-06-06 LAB — BASIC METABOLIC PANEL
BUN: 24 mg/dL — ABNORMAL HIGH (ref 6–23)
CHLORIDE: 99 meq/L (ref 96–112)
CO2: 28 mEq/L (ref 19–32)
Calcium: 10.1 mg/dL (ref 8.4–10.5)
Creatinine, Ser: 1.24 mg/dL — ABNORMAL HIGH (ref 0.50–1.10)
GFR calc Af Amer: 44 mL/min — ABNORMAL LOW (ref >90)
GFR calc non Af Amer: 38 mL/min — ABNORMAL LOW (ref >90)
Glucose, Bld: 94 mg/dL (ref 70–99)
POTASSIUM: 4.4 meq/L (ref 3.7–5.3)
Sodium: 139 mEq/L (ref 137–147)

## 2014-06-06 LAB — CBC
HEMATOCRIT: 40.6 % (ref 36.0–46.0)
HEMOGLOBIN: 13.7 g/dL (ref 12.0–15.0)
MCH: 30.7 pg (ref 26.0–34.0)
MCHC: 33.7 g/dL (ref 30.0–36.0)
MCV: 91 fL (ref 78.0–100.0)
Platelets: 185 10*3/uL (ref 150–400)
RBC: 4.46 MIL/uL (ref 3.87–5.11)
RDW: 13.4 % (ref 11.5–15.5)
WBC: 9.5 10*3/uL (ref 4.0–10.5)

## 2014-06-06 NOTE — Patient Instructions (Addendum)
Kendra Ballard  06/06/2014                           YOUR PROCEDURE IS SCHEDULED ON: 06/12/14 AT 7:30 AM               ENTER [facility] ENTRANCE AND                           FOLLOW  SIGNS TO SHORT STAY CENTER                 ARRIVE AT SHORT STAY AT: 5:30 AM               CALL THIS NUMBER IF ANY PROBLEMS THE DAY OF SURGERY :               [phone]                                REMEMBER:   Do not eat food or drink liquids AFTER MIDNIGHT   CLEAR LIQUIDS ONLY FOR 24 HRS BEFORE SURGERY               Take these medicines the morning of surgery with               A SIPS OF WATER :     CLONAZEPAM    Do not wear jewelry, make-up   Do not wear lotions, powders, or perfumes.   Do not shave legs or underarms 12 hrs. before surgery (men may shave face)  Do not bring valuables to the hospital.  Contacts, dentures or bridgework may not be worn into surgery.  Leave suitcase in the car. After surgery it may be brought to your room.  For patients admitted to the hospital more than one night, checkout time is            11:00 AM                                                       The day of discharge.   Patients discharged the day of surgery will not be allowed to drive home.            If going home same day of surgery, must have someone stay with you              FIRST 24 hrs at home and arrange for some one to drive you              home from hospital.               [name]                                                                     Foods Excluded  Coffee and tea, regular and decaf  liquids that you cannot  Plain Jell-O in any flavor                                             see through such as: Fruit ices (not with fruit pulp)                                     milk, soups, orange juice  Iced Popsicles                                    All solid food Carbonated beverages, regular and  diet                                    Cranberry, grape and apple juices Sports drinks like Gatorade Lightly seasoned clear broth or consume(fat free) Sugar, honey syrup   _____________________________________________________________________     ________________________________________________________________________                                                                        [name]  Before surgery, you can play an important role.  Because skin is not sterile, your skin needs to be as free of germs as possible.  You can reduce the number of germs on your skin by washing with CHG (chlorahexidine gluconate) soap before surgery.  CHG is an antiseptic cleaner which kills germs and bonds with the skin to continue killing germs even after washing. Please DO NOT use if you have an allergy to CHG or antibacterial soaps.  If your skin becomes reddened/irritated stop using the CHG and inform your nurse when you arrive at Short Stay. Do not shave (including legs and underarms) for at least 48 hours prior to the first CHG shower.  You may shave your face. Please follow these instructions carefully:   1.  Shower with CHG Soap the night before surgery and the  morning of Surgery.   2.  If you choose to wash your hair, wash your hair first as usual with your  normal  Shampoo.   3.  After you shampoo, rinse your hair and body thoroughly to remove the  shampoo.                                         4.  Use CHG as you would any other liquid soap.  You can apply chg directly  to the skin and wash . Gently wash with scrungie or clean wascloth    5.  Apply the CHG Soap to your body ONLY FROM THE NECK DOWN.   Do not use on open  Wound or open sores. Avoid contact with eyes, ears mouth and genitals (private parts).                        Genitals (private parts) with your normal soap.              6.  Wash thoroughly, paying special  attention to the area where your surgery  will be performed.   7.  Thoroughly rinse your body with warm water from the neck down.   8.  DO NOT shower/wash with your normal soap after using and rinsing off  the CHG Soap .                9.  Pat yourself dry with a clean towel.             10.  Wear clean pajamas.             11.  Place clean sheets on your bed the night of your first shower and do not  sleep with pets.  Day of Surgery : Do not apply any lotions/deodorants the morning of surgery.  Please wear clean clothes to the hospital/surgery center.  FAILURE TO FOLLOW THESE INSTRUCTIONS MAY RESULT IN THE CANCELLATION OF YOUR SURGERY    PATIENT SIGNATURE_________________________________  ______________________________________________________________________    [name]  An incentive spirometer is a tool that can help keep your lungs clear and active. This tool measures how well you are filling your lungs with each breath. Taking long deep breaths may help reverse or decrease the chance of developing breathing (pulmonary) problems (especially infection) following:  A long period of time when you are unable to move or be active. BEFORE THE PROCEDURE   If the spirometer includes an indicator to show your best effort, your nurse or respiratory therapist will set it to a desired goal.  If possible, sit up straight or lean slightly forward. Try not to slouch.  Hold the incentive spirometer in an upright position. INSTRUCTIONS FOR USE  1. Sit on the edge of your bed if possible, or sit up as far as you can in bed or on a chair. 2. Hold the incentive spirometer in an upright position. 3. Breathe out normally. 4. Place the mouthpiece in your mouth and seal your lips tightly around it. 5. Breathe in slowly and as deeply as possible, raising the piston or the ball toward the top of the column. 6. Hold your breath for 3-5 seconds or for as long as possible. Allow the  piston or ball to fall to the bottom of the column. 7. Remove the mouthpiece from your mouth and breathe out normally. 8. Rest for a few seconds and repeat Steps 1 through 7 at least 10 times every 1-2 hours when you are awake. Take your time and take a few normal breaths between deep breaths. 9. The spirometer may include an indicator to show your best effort. Use the indicator as a goal to work toward during each repetition. 10. After each set of 10 deep breaths, practice coughing to be sure your lungs are clear. If you have an incision (the cut made at the time of surgery), support your incision when coughing by placing a pillow or rolled up towels firmly against it. Once you are able to get out of bed, walk around indoors and cough well. You may stop using the incentive spirometer when instructed by your caregiver.  RISKS AND COMPLICATIONS  Take  your time so you do not get dizzy or light-headed.  If you are in pain, you may need to take or ask for pain medication before doing incentive spirometry. It is harder to take a deep breath if you are having pain. AFTER USE  Rest and breathe slowly and easily.  It can be helpful to keep track of a log of your progress. Your caregiver can provide you with a simple table to help with this. If you are using the spirometer at home, follow these instructions: [place] IF:   You are having difficultly using the spirometer.  You have trouble using the spirometer as often as instructed.  Your pain medication is not giving enough relief while using the spirometer.  You develop fever of 100.5 F (38.1 C) or higher. SEEK IMMEDIATE MEDICAL CARE IF:   You cough up bloody sputum that had not been present before.  You develop fever of 102 F (38.9 C) or greater.  You develop worsening pain at or near the incision site. MAKE SURE YOU:   Understand these instructions.  Will watch your condition.  Will get help right away if you are not  doing well or get worse. Document Released: 04/12/2007 Document Revised: 02/22/2012 Document Reviewed: 06/13/2007 [facility] Patient Information 2014 [place], [place].   ________________________________________________________________________

## 2014-06-06 NOTE — Progress Notes (Signed)
PCP is VYAS,DHRUV B., MD Referring Provider is Marti Sleigh *  Chief Complaint  Patient presents with  . Lung Lesion    Surgical eval, Chest CT 05/04/14    HPI: Patient presents for evaluation of a newly discovered 1.3 cm right upper lobe nodule suspicious for primary bronchogenic carcinoma. The patient recently underwent surgery for carcinoma of the vulva and a preoperative chest x-ray was abnormal. After her surgery she underwent CT scan of the chest which demonstrated the pulmonary nodule right upper lobe. There is a smaller 6 mm nodule in the left upper lobe which did not appear to be high risk for cancer. The vulvar cancer is felt to be local and not metastatic. Patient denies smoking history but did work in the [employer] and her husband was a smoker. He has died 3-4 years ago of pneumonia.  The patient has had no significant weight loss night sweats. She denies history of chronic pulmonary disease such as asthma bronchitis pneumonia TB. She denies recent trauma to her chest, cough, or hemoptysis.  After the surgery by GYN for resection of the vulvar cancer she developed atrial fibrillation which was rate controlled and she is been placed on Eliquis. She denies any bleeding complications.  Unfortunately the patient has developed a local recurrence near the anus and is scheduled for further perineal surgery next week. Past Medical History  Diagnosis Date  . Lichen sclerosus   . Hypertension   . Urge incontinence   . Anxiety   . Arthritis     HANDS  . Recurrent squamous cell carcinoma of vulva   . Dysrhythmia     A FIB    Past Surgical History  Procedure Laterality Date  . Radical vulvectomy  2005    left modified radical vulvectomy, L inguinal lymphadenectomy, rhomboid flap reconstruction by Dr. Fermin Schwab in [place]  . Hernia repair    . Appendectomy    . Vaginal hysterectomy  03/2007    without LSO  . Radical vulvectomy  04/10/2009    at [facility] for  recurrent SCC of the vulva, bilateral inguinal node dissection  . Vulvectomy Left 04/03/2014    Procedure: MODIFIED LEFT POSTERIOR RADICAL VULVECTOMY;  Surgeon: Alvino Chapel, MD;  Location: [facility];  Service: Gynecology;  Laterality: Left;    Family History  Problem Relation Age of Onset  . Hypertension Sister     Social History History  Substance Use Topics  . Smoking status: Never Smoker   . Smokeless tobacco: Not on file  . Alcohol Use: No    Current Outpatient Prescriptions  Medication Sig Dispense Refill  . apixaban (ELIQUIS) 5 MG TABS tablet Take 5 mg by mouth 2 (two) times daily.      Marland Kitchen aspirin-acetaminophen-caffeine (EXCEDRIN MIGRAINE) 250-250-65 MG per tablet Take 1 tablet by mouth every 6 (six) hours as needed for headache.      . clonazePAM (KLONOPIN) 0.5 MG tablet Take 0.5 mg by mouth 2 (two) times daily.       . hydrochlorothiazide (HYDRODIURIL) 25 MG tablet Take 25 mg by mouth every morning.       . Multiple Vitamin (MULTIVITAMIN WITH MINERALS) TABS tablet Take 1 tablet by mouth daily. Centrum silver       No current facility-administered medications for this visit.    Allergies  Allergen Reactions  . Penicillins Rash    Review of Systems she has arthritis in her hands She lives alone She is assisted by her niece She denies symptoms  of heart failure She is right-hand dominant  BP 147/85  Pulse 86  Resp 16  Ht 5\' 2"  (1.575 m)  Wt 113 lb (51.256 kg)  BMI 20.66 kg/m2  SpO2 96% Physical Exam Alert and responsive but frail consistent with her age HEENT bilateral cataract surgery upper and lower partial dental plates Neck no palpable adenopathy or JVD Cardiac regular rhythm 1/6 systolic murmur Abdomen soft nontender without pulsatile mass Lungs clear Extremities mild ankle edema mild arthritic changes of the hands no cyanosis  or tenderness Neuro generally weak, shuffles with gait   Diagnostic Tests: CT scan of chest reviewed with patient  and her niece-suspicious right upper lobe nodule. Could be metastatic or primary bronchogenic. We'll proceed first with PET scan and then transthoracic needle biopsy if PET scan is positive. We will wait on the PET scan until she recovers from her perineal surgery next week  Impression: Right upper lobe 1.3 cm nodular density suspicious for carcinoma  Plan: Proceed with PET scan then discuss findings with patient

## 2014-06-07 ENCOUNTER — Other Ambulatory Visit: Payer: Self-pay

## 2014-06-07 DIAGNOSIS — D381 Neoplasm of uncertain behavior of trachea, bronchus and lung: Secondary | ICD-10-CM

## 2014-06-07 NOTE — Telephone Encounter (Signed)
error 

## 2014-06-07 NOTE — Telephone Encounter (Signed)
This encounter was created in error - please disregard.

## 2014-06-11 NOTE — Anesthesia Preprocedure Evaluation (Addendum)
Anesthesia Evaluation  Patient identified by MRN, date of birth, ID band Patient awake    Reviewed: Allergy & Precautions, H&P , NPO status , Patient's Chart, lab work & pertinent test results  Airway Mallampati: II TM Distance: >3 FB Neck ROM: Full    Dental no notable dental hx. (+) Partial Upper, Partial Lower, Poor Dentition, Chipped, Dental Advisory Given,    Pulmonary neg pulmonary ROS,  breath sounds clear to auscultation  Pulmonary exam normal       Cardiovascular hypertension, Pt. on medications negative cardio ROS  + dysrhythmias Atrial Fibrillation Rhythm:Regular Rate:Normal     Neuro/Psych Anxiety negative neurological ROS     GI/Hepatic negative GI ROS, Neg liver ROS,   Endo/Other  negative endocrine ROS  Renal/GU negative Renal ROS  negative genitourinary   Musculoskeletal negative musculoskeletal ROS (+)   Abdominal   Peds negative pediatric ROS (+)  Hematology negative hematology ROS (+)   Anesthesia Other Findings Patient states she is unable to remove upper denture. Discontinued Eliquis one week ago.  Reproductive/Obstetrics negative OB ROS                         Anesthesia Physical Anesthesia Plan  ASA: III  Anesthesia Plan: General   Post-op Pain Management:    Induction: Intravenous  Airway Management Planned: Oral ETT  Additional Equipment:   Intra-op Plan:   Post-operative Plan: Extubation in OR  Informed Consent: I have reviewed the patients History and Physical, chart, labs and discussed the procedure including the risks, benefits and alternatives for the proposed anesthesia with the patient or authorized representative who has indicated his/her understanding and acceptance.   Dental advisory given  Plan Discussed with: CRNA  Anesthesia Plan Comments:         Anesthesia Quick Evaluation

## 2014-06-12 ENCOUNTER — Ambulatory Visit (HOSPITAL_COMMUNITY): Payer: Medicare Other | Admitting: Anesthesiology

## 2014-06-12 ENCOUNTER — Encounter (HOSPITAL_COMMUNITY): Payer: Medicare Other | Admitting: Anesthesiology

## 2014-06-12 ENCOUNTER — Encounter (HOSPITAL_COMMUNITY)
Admission: RE | Disposition: A | Discharge status: Home / Self Care | Payer: Self-pay | Source: Ambulatory Visit | Attending: Gynecology

## 2014-06-12 ENCOUNTER — Observation Stay (HOSPITAL_COMMUNITY)
Admission: RE | Admit: 2014-06-12 | Discharge: 2014-06-13 | Disposition: A | Discharge status: Home / Self Care | Payer: Medicare Other | Source: Ambulatory Visit | Attending: Gynecology | Admitting: Gynecology

## 2014-06-12 ENCOUNTER — Encounter (HOSPITAL_COMMUNITY): Payer: Self-pay | Admitting: *Deleted

## 2014-06-12 DIAGNOSIS — I10 Essential (primary) hypertension: Secondary | ICD-10-CM | POA: Insufficient documentation

## 2014-06-12 DIAGNOSIS — Z7901 Long term (current) use of anticoagulants: Secondary | ICD-10-CM | POA: Insufficient documentation

## 2014-06-12 DIAGNOSIS — M19049 Primary osteoarthritis, unspecified hand: Secondary | ICD-10-CM | POA: Insufficient documentation

## 2014-06-12 DIAGNOSIS — C519 Malignant neoplasm of vulva, unspecified: Secondary | ICD-10-CM

## 2014-06-12 DIAGNOSIS — R222 Localized swelling, mass and lump, trunk: Secondary | ICD-10-CM | POA: Insufficient documentation

## 2014-06-12 DIAGNOSIS — Z880 Allergy status to penicillin: Secondary | ICD-10-CM | POA: Insufficient documentation

## 2014-06-12 DIAGNOSIS — I4891 Unspecified atrial fibrillation: Secondary | ICD-10-CM | POA: Insufficient documentation

## 2014-06-12 DIAGNOSIS — F411 Generalized anxiety disorder: Secondary | ICD-10-CM | POA: Insufficient documentation

## 2014-06-12 DIAGNOSIS — R918 Other nonspecific abnormal finding of lung field: Secondary | ICD-10-CM

## 2014-06-12 HISTORY — PX: EXAMINATION UNDER ANESTHESIA: SHX1540

## 2014-06-12 SURGERY — EXAM UNDER ANESTHESIA
Anesthesia: General | Site: Vulva

## 2014-06-12 MED ORDER — ONDANSETRON HCL 4 MG PO TABS: 4.0000 mg | ORAL_TABLET | Freq: Four times a day (QID) | ORAL | Status: DC | PRN

## 2014-06-12 MED ORDER — OXYCODONE-ACETAMINOPHEN 5-325 MG PO TABS
1.0000 | ORAL_TABLET | ORAL | Status: DC | PRN
  Administered 2014-06-12: 2 via ORAL
  Filled 2014-06-12: qty 2

## 2014-06-12 MED ORDER — APIXABAN 2.5 MG PO TABS
2.5000 mg | ORAL_TABLET | Freq: Two times a day (BID) | ORAL | Status: DC
  Filled 2014-06-12 (×2): qty 1

## 2014-06-12 MED ORDER — FENTANYL CITRATE 0.05 MG/ML IJ SOLN
INTRAMUSCULAR | Status: DC | PRN
  Administered 2014-06-12 (×2): 25 ug via INTRAVENOUS

## 2014-06-12 MED ORDER — FENTANYL CITRATE 0.05 MG/ML IJ SOLN: 25.0000 ug | INTRAMUSCULAR | Status: DC | PRN

## 2014-06-12 MED ORDER — LACTATED RINGERS IV SOLN
INTRAVENOUS | Status: DC | PRN
  Administered 2014-06-12: 07:00:00 via INTRAVENOUS

## 2014-06-12 MED ORDER — CLONAZEPAM 0.5 MG PO TABS
0.5000 mg | ORAL_TABLET | Freq: Two times a day (BID) | ORAL | Status: DC
  Administered 2014-06-12 – 2014-06-13 (×2): 0.5 mg via ORAL
  Filled 2014-06-12 (×2): qty 1

## 2014-06-12 MED ORDER — ASPIRIN-ACETAMINOPHEN-CAFFEINE 250-250-65 MG PO TABS
1.0000 | ORAL_TABLET | Freq: Four times a day (QID) | ORAL | Status: DC | PRN
  Filled 2014-06-12: qty 1

## 2014-06-12 MED ORDER — ACETIC ACID 0.25 % IR SOLN
Freq: Once | Status: DC
  Filled 2014-06-12: qty 1000

## 2014-06-12 MED ORDER — ONDANSETRON HCL 4 MG/2ML IJ SOLN
INTRAMUSCULAR | Status: DC | PRN
  Administered 2014-06-12: 4 mg via INTRAVENOUS

## 2014-06-12 MED ORDER — HYDROCHLOROTHIAZIDE 25 MG PO TABS
25.0000 mg | ORAL_TABLET | Freq: Every morning | ORAL | Status: DC
  Administered 2014-06-12: 25 mg via ORAL
  Filled 2014-06-12 (×2): qty 1

## 2014-06-12 MED ORDER — LACTATED RINGERS IV SOLN: INTRAVENOUS | Status: DC

## 2014-06-12 MED ORDER — FENTANYL CITRATE 0.05 MG/ML IJ SOLN
INTRAMUSCULAR | Status: AC
  Filled 2014-06-12: qty 5

## 2014-06-12 MED ORDER — PROMETHAZINE HCL 25 MG/ML IJ SOLN: 6.2500 mg | INTRAMUSCULAR | Status: DC | PRN

## 2014-06-12 MED ORDER — KETOROLAC TROMETHAMINE 30 MG/ML IJ SOLN
15.0000 mg | Freq: Four times a day (QID) | INTRAMUSCULAR | Status: DC | PRN
  Filled 2014-06-12: qty 1

## 2014-06-12 MED ORDER — LACTATED RINGERS IV SOLN
INTRAVENOUS | Status: DC
  Administered 2014-06-12 (×2): via INTRAVENOUS

## 2014-06-12 MED ORDER — PROPOFOL 10 MG/ML IV BOLUS
INTRAVENOUS | Status: AC
  Filled 2014-06-12: qty 20

## 2014-06-12 MED ORDER — ONDANSETRON HCL 4 MG/2ML IJ SOLN
INTRAMUSCULAR | Status: AC
  Filled 2014-06-12: qty 2

## 2014-06-12 MED ORDER — PROPOFOL 10 MG/ML IV BOLUS
INTRAVENOUS | Status: DC | PRN
  Administered 2014-06-12: 100 mg via INTRAVENOUS
  Administered 2014-06-12: 30 mg via INTRAVENOUS

## 2014-06-12 MED ORDER — ONDANSETRON HCL 4 MG/2ML IJ SOLN: 4.0000 mg | Freq: Four times a day (QID) | INTRAMUSCULAR | Status: DC | PRN

## 2014-06-12 SURGICAL SUPPLY — 10 items
BRIEF STRETCH FOR OB PAD LRG (UNDERPADS AND DIAPERS) ×2 IMPLANT
DRAPE TABLE BACK 44X90 PK DISP (DRAPES) IMPLANT
GAUZE SPONGE 4X4 16PLY XRAY LF (GAUZE/BANDAGES/DRESSINGS) IMPLANT
GLOVE BIO SURGEON STRL SZ7.5 (GLOVE) ×6 IMPLANT
KIT BASIN OR (CUSTOM PROCEDURE TRAY) ×3 IMPLANT
LEGGING LITHOTOMY PAIR STRL (DRAPES) ×2 IMPLANT
PACK COOL COMFORT PERI COLD (SET/KITS/TRAYS/PACK) ×4 IMPLANT
SUT VIC AB 3-0 SH 27 (SUTURE) ×3
SUT VIC AB 3-0 SH 27X BRD (SUTURE) IMPLANT
YANKAUER SUCT BULB TIP NO VENT (SUCTIONS) ×2 IMPLANT

## 2014-06-12 NOTE — Anesthesia Postprocedure Evaluation (Signed)
Anesthesia Post Note  Patient: Kendra Ballard  Procedure(s) Performed: Procedure(s) (LRB): EXAM UNDER ANES WIDE LOCAL EXCISION PERINEAL LESION  (N/A)  Anesthesia type: General  Patient location: PACU  Post pain: Pain level controlled  Post assessment: Post-op Vital signs reviewed  Last Vitals:  Filed Vitals:   06/12/14 1104  BP: 168/67  Pulse: 60  Temp:   Resp: 16    Post vital signs: Reviewed  Level of consciousness: sedated  Complications: No apparent anesthesia complications

## 2014-06-12 NOTE — Interval H&P Note (Signed)
History and Physical Interval Note:  06/12/2014 6:54 AM  Kendra Ballard  has presented today for surgery, with the diagnosis of VULVAR CANCER   The various methods of treatment have been discussed with the patient and family. After consideration of risks, benefits and other options for treatment, the patient has consented to  Procedure(s): EXAM UNDER ANES WIDE LOCAL EXCISION PERINEAL LESION  (N/A) as a surgical intervention .  The patient's history has been reviewed, patient examined, no change in status, stable for surgery.  I have reviewed the patient's chart and labs.  Questions were answered to the patient's satisfaction.     CLARKE-PEARSON,DANIEL L

## 2014-06-12 NOTE — Op Note (Signed)
Kendra Ballard  female MEDICAL RECORD PQ:[id number] DATE OF BIRTH: Jan 11, 1923 PHYSICIAN: Marti Sleigh, M.D  06/12/2014   OPERATIVE REPORT  PREOPERATIVE DIAGNOSIS: Recurrent vulvar carcinoma  POSTOPERATIVE DIAGNOSIS: Same  PROCEDURE: Wide local excision of peritoneal lesion  SURGEON: Marti Sleigh, M.D ANESTHESIA: Gen. LMA ESTIMATED BLOOD LOSS: Minimal SURGICAL FINDINGS: Examination under anesthesia revealed a 1 cm lesion located correctly in the midportion of the peritoneum. There was scarring especially on the left side of the vulva from prior radical vulvectomy. PROCEDURE:  Patient was brought to the operating room and after satisfactory attainment of general anesthesia was placed in lithotomy position in [place]. The perineum and vagina were prepped and the patient was draped. Surgical timeout was taken. An incision around the lesion was then created in elliptical fashion (horizontally) extending to the anal verge and the most distal vagina cephalad. Scarring was noted beneath the lesion on the left. These electrocautery the tissue was removed down to the transverse perinei muscle. The specimen was oriented with a suture on the vaginal margin. Hemostasis achieved with cautery. The vagina was undermined to mobilize it distally and avoid tension on the suture line. The subcutaneous layer was closed with interrupted 2-0 Vicryl sutures. Skin was reapproximated with 3-0 Vicryl sutures. An ice pack was applied.  Patient was awakened from anesthesia and taken to the recovery room in satisfactory condition. Sponge needle and isthmic counts correct x2.   Marti Sleigh, M.D

## 2014-06-12 NOTE — H&P (View-Only) (Signed)
Consult Note: Gyn-Onc   Kendra Ballard 80 y.o. female  Chief Complaint  Patient presents with  . Vulvar Cancer    Follow up     Assessment : Recurrent carcinoma the vulva   Plan:  We will schedule the patient to undergo resection of the new perineal lesion on June 30 at 7:30 AM.. Risks of surgery including infection and wound breakdown were discussed the patient and her neice. All questions are answered.  Interval history: The patient returns today as scheduled but notes that she has a new painful area on the vulva. She denies any bleeding. She reports she is fatigued.  On recent CT scan she was found to have a lung lesion but she is yet to make an appointment to be evaluated.  HPI:  80 year old white female seen in consultation at the request of Dr.Hjerpe regarding management of a newly diagnosed recurrent vulvar carcinoma.  Patient's history dates back to 2005 when she had a stage II carcinoma of the left vulva. At that time she underwent a left modified radical vulvectomy with rhomboid flap reconstruction and left inguinal lymphadenectomy. Margins and lymph nodes were negative.  Patient had a recurrence on the right vulva in 2010 and underwent a modified right radical vulvectomy and right inguinal lymphadenectomy at [place]. Once again margins and lymph nodes were negative.  Patient gives a history of approximate 6 months of a painful lesion on the left vulva.  She underwent a left posterior modified radical vulvectomy on 04/03/2014. Final pathology showed negative margins.  Review of Systems:10 point review of systems is negative except as noted in interval history.   Vitals: Blood pressure 144/78, pulse 89, temperature 97.8 F (36.6 C), temperature source Oral, resp. rate 18, height 5' 0.2" (1.529 m), weight 113 lb 14.4 oz (51.665 kg).  Physical Exam: General : The patient is a healthy woman in no acute distress.  HEENT: normocephalic, extraoccular  movements normal; neck is supple without thyromegally  Lynphnodes: Supraclavicular and inguinal nodes not enlarged  Abdomen: Soft, non-tender, no ascites, no organomegally, no masses, no hernias  Pelvic:  EGBUS: Normal female . The the previous surgical site is well healed. However in the midline is a 1 cm granular exceedingly painful lesion break close to the anus. Vagina: Normal, no lesions , atrophic Urethra and Bladder: Normal, non-tender  Cervix: Surgically absent  Uterus: Surgically absent  Bi-manual examination: Non-tender; no adenxal masses or nodularity  Rectal: normal sphincter tone, no masses, no blood  Lower extremities: No edema or varicosities. Normal range of motion      Allergies  Allergen Reactions  . Penicillins Rash    Past Medical History  Diagnosis Date  . Lichen sclerosus   . Hypertension   . Urge incontinence   . Anxiety   . Arthritis     HANDS  . Recurrent squamous cell carcinoma of vulva     Past Surgical History  Procedure Laterality Date  . Radical vulvectomy  2005    left modified radical vulvectomy, L inguinal lymphadenectomy, rhomboid flap reconstruction by Dr. Fermin Schwab in [place]  . Hernia repair    . Appendectomy    . Vaginal hysterectomy  03/2007    without LSO  . Radical vulvectomy  04/10/2009    at [facility] for recurrent SCC of the vulva, bilateral inguinal node dissection  . Vulvectomy Left 04/03/2014    Procedure: MODIFIED LEFT POSTERIOR RADICAL VULVECTOMY;  Surgeon: Alvino Chapel, MD;  Location: [facility];  Service:  Gynecology;  Laterality: Left;    Current Outpatient Prescriptions  Medication Sig Dispense Refill  . apixaban (ELIQUIS) 5 MG TABS tablet Take 1 tablet (5 mg total) by mouth 2 (two) times daily.  14 tablet  0  . aspirin-acetaminophen-caffeine (EXCEDRIN MIGRAINE) 329-924-26 MG per tablet Take 1 tablet by mouth every 6 (six) hours as needed for headache.      . clonazePAM (KLONOPIN) 0.5 MG tablet Take  0.5 mg by mouth 2 (two) times daily.       . hydrochlorothiazide (HYDRODIURIL) 25 MG tablet Take 25 mg by mouth daily. PT STATES SHE USUALLY ONLY TAKES EVERY OTHER DAY      . Multiple Vitamin (MULTIVITAMIN WITH MINERALS) TABS tablet Take 1 tablet by mouth daily. Centrum silver      . PREVNAR 13 SUSP injection Inject 0.5 mLs into the muscle. Treat pneumonia       No current facility-administered medications for this visit.    History   Social History  . Marital Status: Widowed    Spouse Name: N/A    Number of Children: N/A  . Years of Education: N/A   Occupational History  . Not on file.   Social History Main Topics  . Smoking status: Never Smoker   . Smokeless tobacco: Not on file  . Alcohol Use: No  . Drug Use: No  . Sexual Activity: No   Other Topics Concern  . Not on file   Social History Narrative  . No narrative on file    Family History  Problem Relation Age of Onset  . Hypertension Sister       Alvino Chapel, MD 05/30/2014, 1:28 PM

## 2014-06-12 NOTE — Transfer of Care (Signed)
Immediate Anesthesia Transfer of Care Note  Patient: Kendra Ballard  Procedure(s) Performed: Procedure(s): EXAM UNDER ANES WIDE LOCAL EXCISION PERINEAL LESION  (N/A)  Patient Location: PACU  Anesthesia Type:General  Level of Consciousness: awake, alert , oriented and patient cooperative  Airway & Oxygen Therapy: Patient Spontanous Breathing and Patient connected to face mask oxygen  Post-op Assessment: Report given to PACU RN and Post -op Vital signs reviewed and stable  Post vital signs: Reviewed and stable  Complications: No apparent anesthesia complications

## 2014-06-13 ENCOUNTER — Encounter (HOSPITAL_COMMUNITY): Payer: Self-pay | Admitting: Gynecology

## 2014-06-13 MED ORDER — OXYCODONE-ACETAMINOPHEN 5-325 MG PO TABS: 1.0000 | ORAL_TABLET | ORAL | Status: AC | PRN

## 2014-06-13 NOTE — Progress Notes (Signed)
Pt. Has been discharged home. She was given her discharge instructions, prescriptions, and all questions were answered.  She is to be transported home by her family.

## 2014-06-13 NOTE — Discharge Instructions (Signed)
06/13/2014    Activity: 1. Be up and out of the bed during the day.  Take a nap if needed.  You may walk up steps but be careful and use the hand rail.  Stair climbing will tire you more than you think, you may need to stop part way and rest.   2. Do Not drive if you are taking narcotic pain medicine.  3.   Shower daily.  Use soap and water on your incision and pat dry; don't rub.   4. No sexual activity and nothing in the vagina for 6 weeks.  Diet: 1. Low sodium Heart Healthy Diet is recommended.  2. It is safe to use a laxative if you have difficulty moving your bowels.   Wound Care: 1. Keep clean and dry.  Shower daily.  Reasons to call the Doctor:   Fever - Oral temperature greater than 100.4 degrees Fahrenheit  Foul-smelling vaginal discharge  Difficulty urinating  Nausea and vomiting  Increased pain at the site of the incision that is unrelieved with pain medicine.  Difficulty breathing with or without chest pain  New calf pain especially if only on one side  Sudden, continuing increased vaginal bleeding with or without clots.    Contacts: For questions or concerns you should contact:  Dr. Lahoma Crocker at [phone]  Dr. Marti Sleigh at [facility] [phone]

## 2014-06-13 NOTE — Discharge Summary (Signed)
Physician Discharge Summary  Patient ID: Kendra Ballard MRN: [id number] DOB/AGE: 78-Nov-1924 78 y.o.  Admit date: 06/12/2014 Discharge date: 06/13/2014  Admission Diagnoses: Vulva cancer  Discharge Diagnoses:  Active Problems:   Vulva cancer   Lung mass   Discharged Condition: good  Hospital Course: On 06/12/2014, the patient underwent the following: Procedure(s): EXAM UNDER ANES WIDE LOCAL EXCISION PERINEAL LESION .   The postoperative course was uneventful.  She was discharged to home on postoperative day 1 tolerating a regular diet.  Consults: None  Significant Diagnostic Studies: None  Treatments: surgery: see above  Discharge Exam: Blood pressure 115/52, pulse 60, temperature 98.2 F (36.8 C), temperature source Oral, resp. rate 16, height 5\' 2"  (1.575 m), weight 51.846 kg (114 lb 4.8 oz), SpO2 95.00%. General appearance: alert Resp: clear to auscultation bilaterally Cardio: regular rate and rhythm, S1, S2 normal, no murmur, click, rub or gallop Pelvic: external genitalia; sutures intact, no drainage, small induration Extremities: extremities normal, atraumatic, no cyanosis or edema and Homans sign is negative, no sign of DVT  Disposition: 01-Home or Self Care  Discharge Instructions   Call MD for:  extreme fatigue    Complete by:  As directed      Call MD for:  persistant dizziness or light-headedness    Complete by:  As directed      Call MD for:  persistant nausea and vomiting    Complete by:  As directed      Call MD for:  redness, tenderness, or signs of infection (pain, swelling, redness, odor or green/yellow discharge around incision site)    Complete by:  As directed      Call MD for:  severe uncontrolled pain    Complete by:  As directed      Call MD for:  temperature >100.4    Complete by:  As directed      Diet - low sodium heart healthy    Complete by:  As directed      Diet Carb Modified    Complete by:  As directed      Discharge wound care:     Complete by:  As directed   Keep clean and dry     Driving Restrictions    Complete by:  As directed   No driving for 1- 2 weeks     Increase activity slowly    Complete by:  As directed      May shower / Bathe    Complete by:  As directed   No tub baths for 6 weeks     May walk up steps    Complete by:  As directed      Sexual Activity Restrictions    Complete by:  As directed   No intercourse for 6  weeks            Medication List         aspirin-acetaminophen-caffeine 782-956-21 MG per tablet  Commonly known as:  EXCEDRIN MIGRAINE  Take 1 tablet by mouth every 6 (six) hours as needed for headache.     clonazePAM 0.5 MG tablet  Commonly known as:  KLONOPIN  Take 0.5 mg by mouth 2 (two) times daily.     ELIQUIS 5 MG Tabs tablet  Generic drug:  apixaban  Take 5 mg by mouth 2 (two) times daily.     hydrochlorothiazide 25 MG tablet  Commonly known as:  HYDRODIURIL  Take 25 mg by mouth every morning.  multivitamin with minerals Tabs tablet  Take 1 tablet by mouth daily. Centrum silver     oxyCODONE-acetaminophen 5-325 MG per tablet  Commonly known as:  PERCOCET/ROXICET  Take 1-2 tablets by mouth every 4 (four) hours as needed for severe pain (moderate to severe pain (when tolerating fluids)).           Follow-up Information   Schedule an appointment as soon as possible for a visit with Alvino Chapel, MD.   Specialty:  Gynecology   Contact information:   [address]. [place] [address] [phone]       Signed: Agnes Lawrence 06/13/2014, 9:10 AM

## 2014-06-20 ENCOUNTER — Ambulatory Visit (HOSPITAL_COMMUNITY)
Admission: RE | Admit: 2014-06-20 | Discharge: 2014-06-20 | Disposition: A | Discharge status: Home / Self Care | Payer: Medicare Other | Source: Ambulatory Visit | Attending: Cardiothoracic Surgery | Admitting: Cardiothoracic Surgery

## 2014-06-20 DIAGNOSIS — I700 Atherosclerosis of aorta: Secondary | ICD-10-CM | POA: Insufficient documentation

## 2014-06-20 DIAGNOSIS — I2510 Atherosclerotic heart disease of native coronary artery without angina pectoris: Secondary | ICD-10-CM | POA: Insufficient documentation

## 2014-06-20 DIAGNOSIS — D381 Neoplasm of uncertain behavior of trachea, bronchus and lung: Secondary | ICD-10-CM | POA: Insufficient documentation

## 2014-06-20 DIAGNOSIS — K5730 Diverticulosis of large intestine without perforation or abscess without bleeding: Secondary | ICD-10-CM | POA: Insufficient documentation

## 2014-06-20 DIAGNOSIS — R918 Other nonspecific abnormal finding of lung field: Secondary | ICD-10-CM | POA: Insufficient documentation

## 2014-06-20 DIAGNOSIS — I517 Cardiomegaly: Secondary | ICD-10-CM | POA: Insufficient documentation

## 2014-06-20 LAB — GLUCOSE, CAPILLARY: Glucose-Capillary: 102 mg/dL — ABNORMAL HIGH (ref 70–99)

## 2014-06-20 MED ORDER — FLUDEOXYGLUCOSE F - 18 (FDG) INJECTION
8.0000 | Freq: Once | INTRAVENOUS | Status: AC | PRN
  Administered 2014-06-20: 8 via INTRAVENOUS

## 2014-06-27 ENCOUNTER — Encounter: Payer: Self-pay | Admitting: Cardiothoracic Surgery

## 2014-06-27 ENCOUNTER — Ambulatory Visit (INDEPENDENT_AMBULATORY_CARE_PROVIDER_SITE_OTHER): Payer: Medicare Other | Admitting: Cardiothoracic Surgery

## 2014-06-27 VITALS — BP 125/76 | HR 90 | Resp 20 | Ht 62.0 in | Wt 114.0 lb

## 2014-06-27 DIAGNOSIS — R918 Other nonspecific abnormal finding of lung field: Secondary | ICD-10-CM

## 2014-06-27 NOTE — Progress Notes (Signed)
PCP is VYAS,DHRUV B., MD Referring Provider is Marti Sleigh *  Chief Complaint  Patient presents with  . Lung Lesion    S/P PET Scan, discuss results    HPI: 80 year old nonsmoker returns for further discussion of bilateral upper lobe pulmonary nodules and to review PET scan. She recently had a second vulvar area for recurrent disease.  PET scan shows bilateral upper lobe nodules sub centimeter but with metabolic activity  2-3 suv. A left hilar lymph node also has hypermetabolic activity of 4.0 SUV. No other areas of hypermetabolic daily R. demonstrated. No evidence of metastatic disease. The patient has not had a brain scan.   Past Medical History  Diagnosis Date  . Lichen sclerosus   . Hypertension   . Urge incontinence   . Anxiety   . Arthritis     HANDS  . Recurrent squamous cell carcinoma of vulva   . Dysrhythmia     A FIB    Past Surgical History  Procedure Laterality Date  . Radical vulvectomy  2005    left modified radical vulvectomy, L inguinal lymphadenectomy, rhomboid flap reconstruction by Dr. Fermin Schwab in [place]  . Hernia repair    . Appendectomy    . Vaginal hysterectomy  03/2007    without LSO  . Radical vulvectomy  04/10/2009    at [facility] for recurrent SCC of the vulva, bilateral inguinal node dissection  . Vulvectomy Left 04/03/2014    Procedure: MODIFIED LEFT POSTERIOR RADICAL VULVECTOMY;  Surgeon: Alvino Chapel, MD;  Location: [facility];  Service: Gynecology;  Laterality: Left;  . Examination under anesthesia N/A 06/12/2014    Procedure: EXAM UNDER ANES WIDE LOCAL EXCISION PERINEAL LESION ;  Surgeon: Alvino Chapel, MD;  Location: [facility];  Service: Gynecology;  Laterality: N/A;    Family History  Problem Relation Age of Onset  . Hypertension Sister     Social History History  Substance Use Topics  . Smoking status: Never Smoker   . Smokeless tobacco: Not on file  . Alcohol Use: No    Current Outpatient  Prescriptions  Medication Sig Dispense Refill  . apixaban (ELIQUIS) 5 MG TABS tablet Take 5 mg by mouth 2 (two) times daily.      Marland Kitchen aspirin-acetaminophen-caffeine (EXCEDRIN MIGRAINE) 250-250-65 MG per tablet Take 1 tablet by mouth every 6 (six) hours as needed for headache.      . clonazePAM (KLONOPIN) 0.5 MG tablet Take 0.5 mg by mouth 2 (two) times daily.       . hydrochlorothiazide (HYDRODIURIL) 25 MG tablet Take 25 mg by mouth every morning.       . Multiple Vitamin (MULTIVITAMIN WITH MINERALS) TABS tablet Take 1 tablet by mouth daily. Centrum silver      . oxyCODONE-acetaminophen (PERCOCET/ROXICET) 5-325 MG per tablet Take 1-2 tablets by mouth every 4 (four) hours as needed for severe pain (moderate to severe pain (when tolerating fluids)).  30 tablet  0   No current facility-administered medications for this visit.    Allergies  Allergen Reactions  . Penicillins Rash    Review of Systems she tolerated her redo vulvar resection well.  BP 125/76  Pulse 90  Resp 20  Ht 5\' 2"  (1.575 m)  Wt 114 lb (51.71 kg)  BMI 20.85 kg/m2  SpO2 92% Physical Exam Alert and pleasant Neck without adenopathy or JVD Breath sounds equal Heart rhythm regular-atrial fibrillation  Diagnostic Tests: PET scan shows bilateral nodular disease with hypermetabolic activity suspicious for malignancy with  a left hilar node also suspicious for malignancy.  Biopsy of pulmonary nodule was then recommended to the patient but she does not wish any further intervention treatment or followup of pulmonary nodular disease. She understands that there is a high likelihood she has primary lung cancer but she states she would not want any treatment including surgery, chemotherapy, radiation therapy and will live her life out without further scans as well.  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 With low activity and PET scan the tumor probably is not aggressive. She has declined further evaluation  including biopsy or treatment.  Plan: Return as needed.

## 2014-07-27 ENCOUNTER — Ambulatory Visit: Payer: Medicare Other | Attending: Gynecology | Admitting: Gynecology

## 2014-07-27 ENCOUNTER — Encounter: Payer: Self-pay | Admitting: Gynecology

## 2014-07-27 ENCOUNTER — Ambulatory Visit: Payer: Medicare Other | Admitting: Gynecology

## 2014-07-27 VITALS — BP 127/101 | HR 78 | Temp 98.7°F | Resp 16 | Ht 60.0 in | Wt 111.8 lb

## 2014-07-27 DIAGNOSIS — Z9079 Acquired absence of other genital organ(s): Secondary | ICD-10-CM | POA: Insufficient documentation

## 2014-07-27 DIAGNOSIS — Z880 Allergy status to penicillin: Secondary | ICD-10-CM | POA: Insufficient documentation

## 2014-07-27 DIAGNOSIS — I4891 Unspecified atrial fibrillation: Secondary | ICD-10-CM | POA: Diagnosis not present

## 2014-07-27 DIAGNOSIS — C519 Malignant neoplasm of vulva, unspecified: Secondary | ICD-10-CM | POA: Diagnosis present

## 2014-07-27 DIAGNOSIS — Z79899 Other long term (current) drug therapy: Secondary | ICD-10-CM | POA: Diagnosis not present

## 2014-07-27 DIAGNOSIS — M19049 Primary osteoarthritis, unspecified hand: Secondary | ICD-10-CM | POA: Insufficient documentation

## 2014-07-27 DIAGNOSIS — Z7982 Long term (current) use of aspirin: Secondary | ICD-10-CM | POA: Diagnosis not present

## 2014-07-27 DIAGNOSIS — I10 Essential (primary) hypertension: Secondary | ICD-10-CM | POA: Insufficient documentation

## 2014-07-27 DIAGNOSIS — F411 Generalized anxiety disorder: Secondary | ICD-10-CM | POA: Diagnosis not present

## 2014-07-27 NOTE — Progress Notes (Signed)
Consult Note: Gyn-Onc   Kendra Ballard 80 y.o. female  Chief Complaint  Patient presents with  . Recurrent vulvar cancer    Assessment : Recurrent carcinoma the vulva with good wound healing. She may return for levels of activity.  Plan:   Patient will return to see me in 6 months or as needed. She's encouraged to use ibuprofen 400-600 mg for back pain.  Interval history: The patient returns today as scheduled. She underwent excision of a recurrent vulvar carcinoma and the perineum on 06/03/2014. Final pathology showed negative surgical margins and an early invasive vulvar carcinoma. The patient reports that her pain is completely resolved postoperatively. She had no problems her postoperative healing. Overall she's doing well but somewhat fatigued. She notes that the oxycodone and has really helped her with her back pain and has asked for another prescription although I would prefer that she use ibuprofen. She denies any GI or GU symptoms.    HPI:  80 year old white female seen in consultation at the request of Dr.Hjerpe regarding management of a newly diagnosed recurrent vulvar carcinoma.  Patient's history dates back to 2005 when she had a stage II carcinoma of the left vulva. At that time she underwent a left modified radical vulvectomy with rhomboid flap reconstruction and left inguinal lymphadenectomy. Margins and lymph nodes were negative.  Patient had a recurrence on the right vulva in 2010 and underwent a modified right radical vulvectomy and right inguinal lymphadenectomy at [facility] Once again margins and lymph nodes were negative.  Patient gives a history of approximate 6 months of a painful lesion on the left vulva.  She underwent a left posterior modified radical vulvectomy on 04/03/2014. Final pathology showed negative margins.  She was found to have yet another recurrence in June 2015 which is very small but painful on her perineum. This was excised with  negative margins and an uncomplicated postoperative course.  Review of Systems:10 point review of systems is negative except as noted in interval history.   Vitals: Blood pressure 127/101, pulse 78, temperature 98.7 F (37.1 C), temperature source Oral, resp. rate 16, height 5' (1.524 m), weight 111 lb 12.8 oz (50.712 kg).  Physical Exam: General : The patient is a healthy woman in no acute distress.  HEENT: normocephalic, extraoccular movements normal; neck is supple without thyromegally  Lynphnodes: Supraclavicular and inguinal nodes not enlarged  Abdomen: Soft, non-tender, no ascites, no organomegally, no masses, no hernias  Pelvic:  EGBUS: Normal female .  The perineum is well-healed. No lesions are noted. Vagina: Normal, no lesions , atrophic Urethra and Bladder: Normal, non-tender  Cervix: Surgically absent  Uterus: Surgically absent  Bi-manual examination: Non-tender; no adenxal masses or nodularity  Rectal: normal sphincter tone, no masses, no blood  Lower extremities: No edema or varicosities. Normal range of motion      Allergies  Allergen Reactions  . Penicillins Rash    Past Medical History  Diagnosis Date  . Lichen sclerosus   . Hypertension   . Urge incontinence   . Anxiety   . Arthritis     HANDS  . Recurrent squamous cell carcinoma of vulva   . Dysrhythmia     A FIB    Past Surgical History  Procedure Laterality Date  . Radical vulvectomy  2005    left modified radical vulvectomy, L inguinal lymphadenectomy, rhomboid flap reconstruction by Dr. Fermin Schwab in [place]  . Hernia repair    . Appendectomy    . Vaginal hysterectomy  03/2007    without LSO  . Radical vulvectomy  04/10/2009    at [facility] for recurrent SCC of the vulva, bilateral inguinal node dissection  . Vulvectomy Left 04/03/2014    Procedure: MODIFIED LEFT POSTERIOR RADICAL VULVECTOMY;  Surgeon: Alvino Chapel, MD;  Location: [facility];  Service: Gynecology;   Laterality: Left;  . Examination under anesthesia N/A 06/12/2014    Procedure: EXAM UNDER ANES WIDE LOCAL EXCISION PERINEAL LESION ;  Surgeon: Alvino Chapel, MD;  Location: [facility];  Service: Gynecology;  Laterality: N/A;    Current Outpatient Prescriptions  Medication Sig Dispense Refill  . apixaban (ELIQUIS) 5 MG TABS tablet Take 5 mg by mouth 2 (two) times daily.      Marland Kitchen aspirin-acetaminophen-caffeine (EXCEDRIN MIGRAINE) 250-250-65 MG per tablet Take 1 tablet by mouth every 6 (six) hours as needed for headache.      . clonazePAM (KLONOPIN) 0.5 MG tablet Take 0.5 mg by mouth 2 (two) times daily.       . hydrochlorothiazide (HYDRODIURIL) 25 MG tablet Take 25 mg by mouth every morning.       . Multiple Vitamin (MULTIVITAMIN WITH MINERALS) TABS tablet Take 1 tablet by mouth daily. Centrum silver      . oxyCODONE-acetaminophen (PERCOCET/ROXICET) 5-325 MG per tablet Take 1-2 tablets by mouth every 4 (four) hours as needed for severe pain (moderate to severe pain (when tolerating fluids)).  30 tablet  0   No current facility-administered medications for this visit.    History   Social History  . Marital Status: Widowed    Spouse Name: N/A    Number of Children: N/A  . Years of Education: N/A   Occupational History  . Not on file.   Social History Main Topics  . Smoking status: Never Smoker   . Smokeless tobacco: Not on file  . Alcohol Use: No  . Drug Use: No  . Sexual Activity: No   Other Topics Concern  . Not on file   Social History Narrative  . No narrative on file    Family History  Problem Relation Age of Onset  . Hypertension Sister       Alvino Chapel, MD 07/27/2014, 8:33 AM

## 2014-07-27 NOTE — Patient Instructions (Signed)
Plan to follow up in six months or sooner if needed.  Call in three to four months to schedule your appointment.  Please call for any issues or concerns, any development of pain or sores in the vulvar area.  Try taking two ibuprofen tablets every six hours as needed for your back pain and follow up with your family doctor.

## 2014-08-14 ENCOUNTER — Telehealth: Payer: Self-pay | Admitting: *Deleted

## 2014-08-14 ENCOUNTER — Encounter: Payer: Self-pay | Admitting: *Deleted

## 2014-08-14 DIAGNOSIS — I4891 Unspecified atrial fibrillation: Secondary | ICD-10-CM

## 2014-08-14 NOTE — Telephone Encounter (Signed)
Per recent chart follow up -  Lab for TSH never done.  Letter & order mailed today.

## 2014-08-17 ENCOUNTER — Encounter: Payer: Self-pay | Admitting: Cardiology

## 2015-01-11 ENCOUNTER — Telehealth: Payer: Self-pay | Admitting: Cardiology

## 2015-01-11 NOTE — Telephone Encounter (Signed)
Patient asked that we take her out of our system as a patient of [facility].  Would not provide a reason

## 2015-11-26 IMAGING — CT CT CHEST W/O CM
2 of 4 series · 15 of 36 positions shown, 18 images · non-contrast
Comparison: Chest radiographs of 03/30/2014 and 10/15/2004

CLINICAL DATA: Possible left-sided lung nodule on chest radiograph.
Hypertension. Anxiety. Arthritis. History of cancer of the vulva.

EXAM:
CT CHEST WITHOUT CONTRAST
TECHNIQUE: Multidetector CT imaging of the chest was performed following the
standard protocol without IV contrast..

[Series 2: chest w/o st · axial · non-contrast · 0.60mm/px · z∈[-330,-54]mm · 12 of 65 slices shown, 15 images]
[im 5/65  mediastinal]
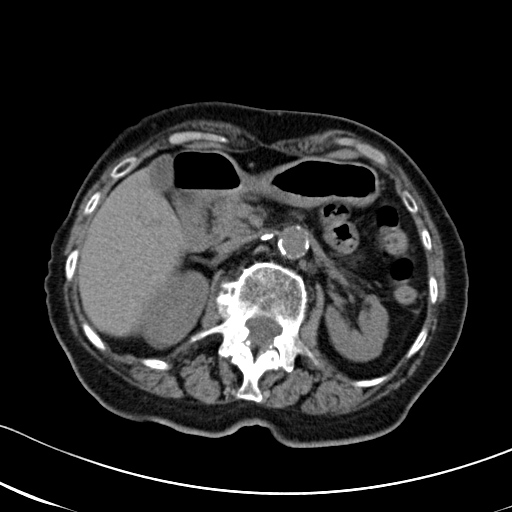
[im 5/65  lung]
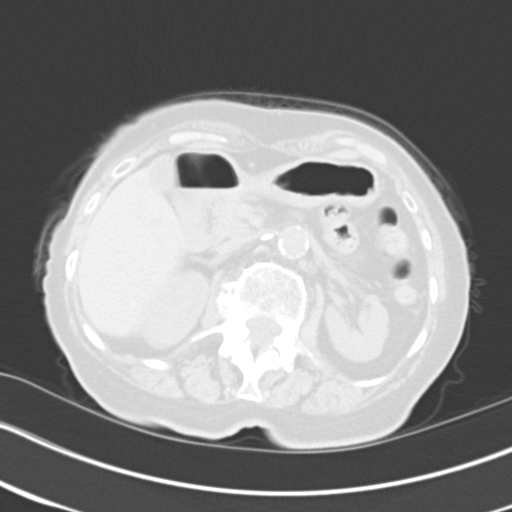
[im 10/65  lung]
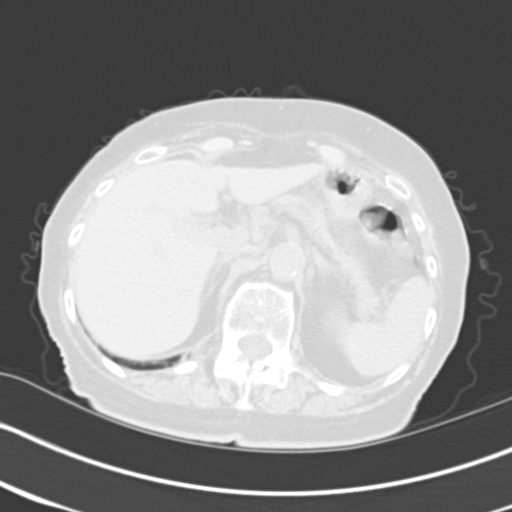
[im 15/65  lung]
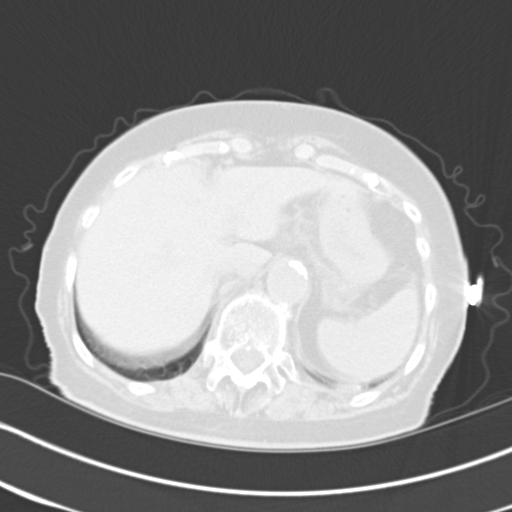
[im 20/65  lung]
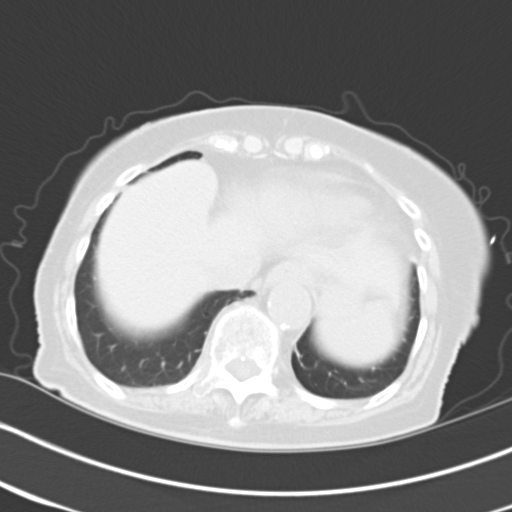
[im 25/65  mediastinal]
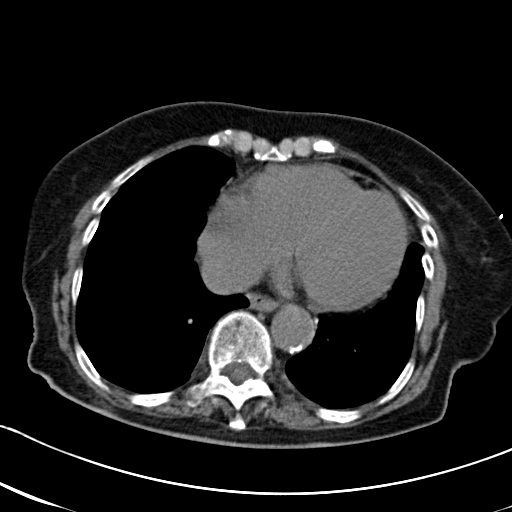
[im 25/65  lung]
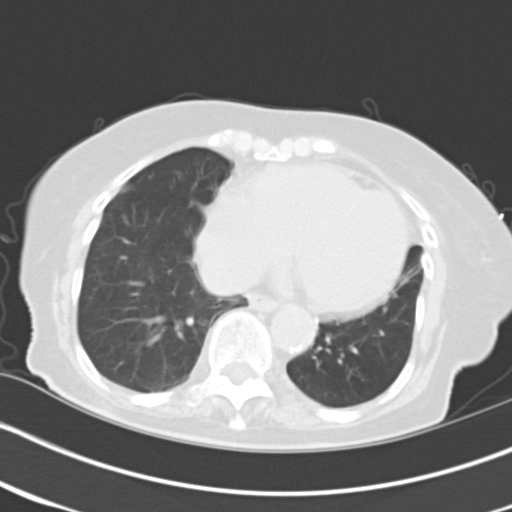
[im 30/65  lung]
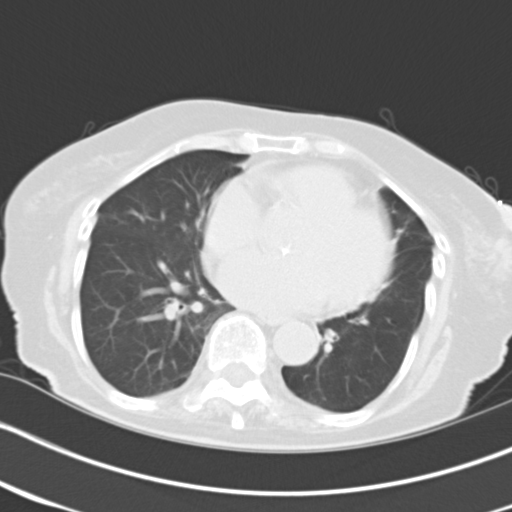
[im 35/65  lung]
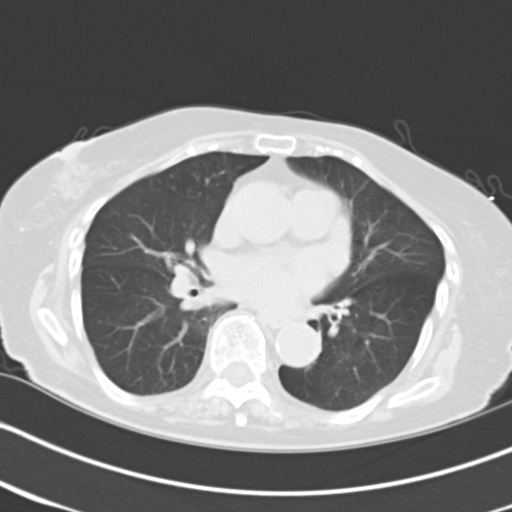
[im 40/65  lung]
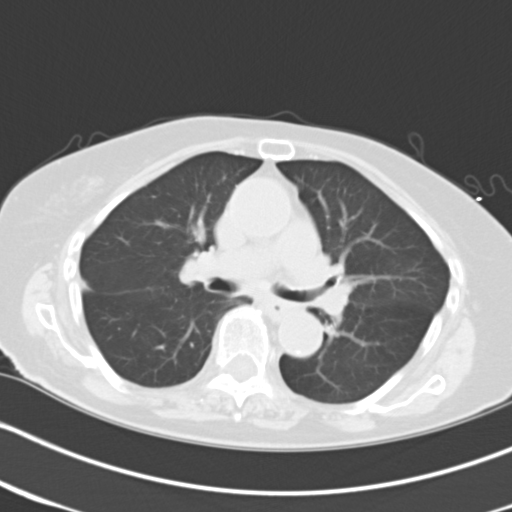
[im 45/65  mediastinal]
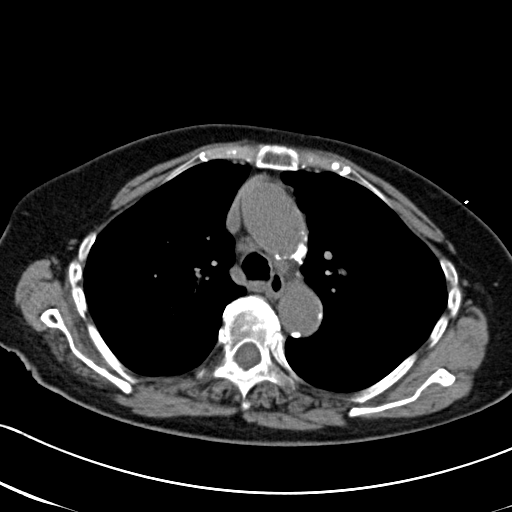
[im 45/65  lung]
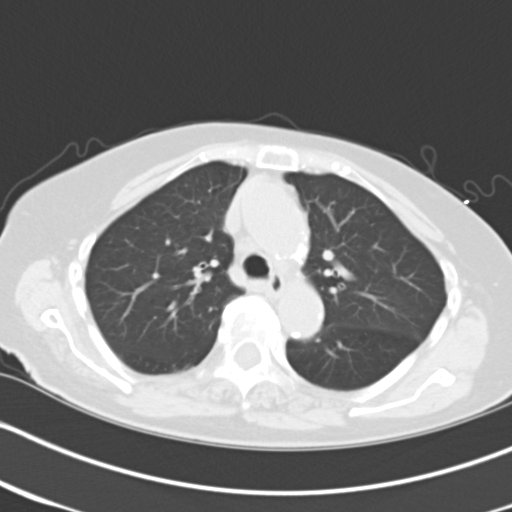
[im 50/65  lung]
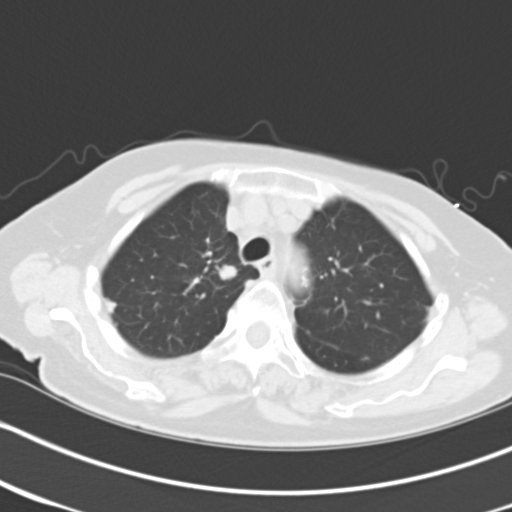
[im 55/65  lung]
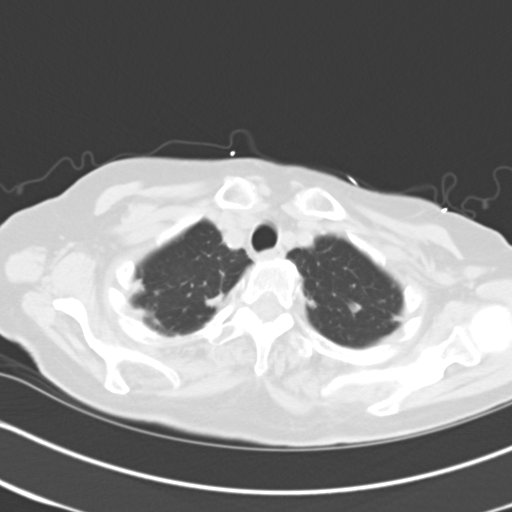
[im 60/65  lung]
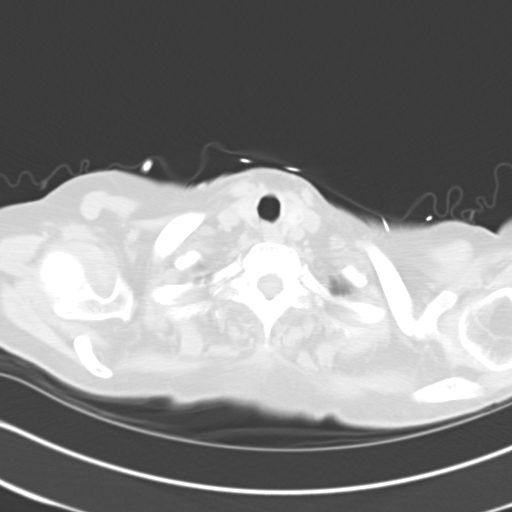

[Series 602: <mpr thick range> · coronal · 0.63mm/px · 3 of 67 slices shown]
[im 14/67  lung]
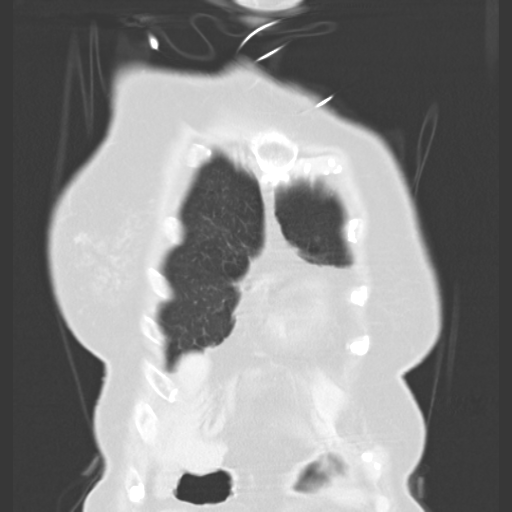
[im 27/67  lung]
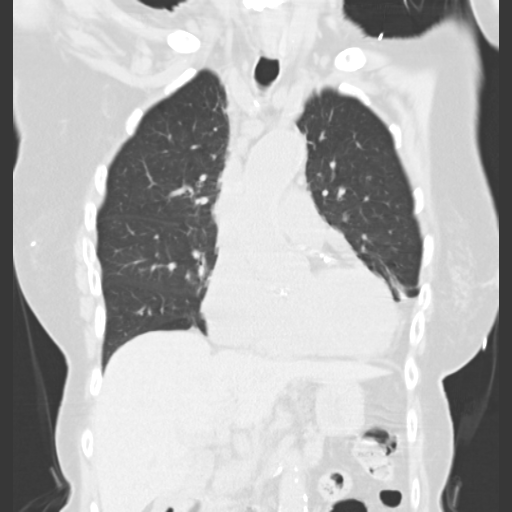
[im 40/67  lung]
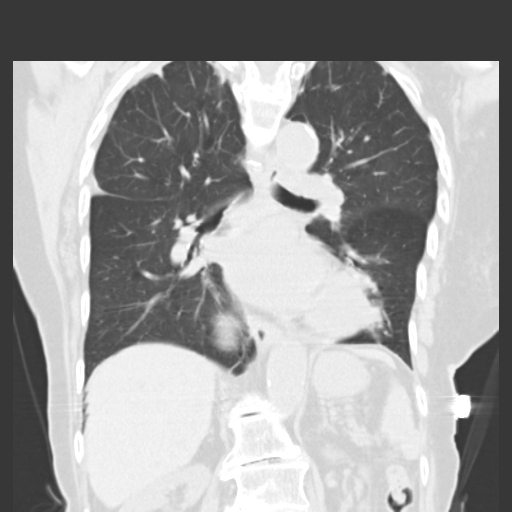

[15 of 36 positions shown; findings below may reference images not displayed]

FINDINGS: Lungs/Pleura: Biapical pleural parenchymal scarring. Right apical
lung nodule which measures 1.1 x 1.0 cm on image 16/series 5.

Mild motion degradation inferiorly.

Left apical lung nodule measures 7 mm on image 12.

No chest CT correlate for the questioned left-sided nodule on prior
plain film.

There is linear scarring in the lingula and left lower lobe
anteriorly.

No pleural fluid.

Heart/Mediastinum: Atherosclerosis, including within the coronary
arteries. Low right paratracheal node measures 1.5 cm on image 23.
Hilar regions poorly evaluated without intravenous contrast. No
supraclavicular adenopathy.

Upper Abdomen: Normal noncontrast appearance of the liver, spleen,
stomach. Atrophic pancreas. Mildly atrophic left kidney.

Bones/Musculoskeletal:  Mild osteopenia.  Thoracic spondylosis.
IMPRESSION: 1. No CT correlate for the questioned abnormality within the left
lung.
2. Bilateral upper lobe pulmonary nodules. The larger is in the
right upper lobe and measures 1.1 cm. Despite the clinical history
of primary malignancy, these are more suspicious for primary
bronchogenic carcinoma or carcinomas. An atypical appearance of
metastatic disease is felt less likely. If this 89-year-old patient
is a treatment candidate, further evaluation with PET should be
considered.
3. Mild thoracic adenopathy.  Cannot exclude nodal metastasis.
4. These results will be called to the ordering clinician or
representative by the Radiologist Assistant, and communication
documented in the PACS or zVision Dashboard.

## 2016-07-07 NOTE — Progress Notes (Signed)
This encounter was created in error - please disregard.

## 2018-04-13 DEATH — deceased
